# Patient Record
Sex: Male | Born: 1955 | Race: White | Hispanic: No | Marital: Married | Smoking: Former smoker
Health system: Southern US, Community
[De-identification: ages and names within clinical notes are randomized; demographics above are authoritative.]

## PROBLEM LIST (undated history)

## (undated) DIAGNOSIS — E119 Type 2 diabetes mellitus without complications: Secondary | ICD-10-CM

## (undated) DIAGNOSIS — M1990 Unspecified osteoarthritis, unspecified site: Secondary | ICD-10-CM

## (undated) DIAGNOSIS — R7303 Prediabetes: Secondary | ICD-10-CM

## (undated) DIAGNOSIS — T7840XA Allergy, unspecified, initial encounter: Secondary | ICD-10-CM

## (undated) DIAGNOSIS — E785 Hyperlipidemia, unspecified: Secondary | ICD-10-CM

## (undated) DIAGNOSIS — I10 Essential (primary) hypertension: Secondary | ICD-10-CM

## (undated) DIAGNOSIS — J449 Chronic obstructive pulmonary disease, unspecified: Secondary | ICD-10-CM

## (undated) DIAGNOSIS — C801 Malignant (primary) neoplasm, unspecified: Secondary | ICD-10-CM

## (undated) HISTORY — DX: Allergy, unspecified, initial encounter: T78.40XA

## (undated) HISTORY — PX: EYE SURGERY: SHX253

## (undated) HISTORY — PX: TONSILLECTOMY: SUR1361

## (undated) HISTORY — PX: JOINT REPLACEMENT: SHX530

## (undated) HISTORY — DX: Malignant (primary) neoplasm, unspecified: C80.1

## (undated) HISTORY — DX: Chronic obstructive pulmonary disease, unspecified: J44.9

## (undated) HISTORY — DX: Unspecified osteoarthritis, unspecified site: M19.90

## (undated) HISTORY — DX: Type 2 diabetes mellitus without complications: E11.9

## (undated) HISTORY — PX: KNEE SURGERY: SHX244

## (undated) HISTORY — PX: OTHER SURGICAL HISTORY: SHX169

---

## 2013-09-17 DIAGNOSIS — Z96652 Presence of left artificial knee joint: Secondary | ICD-10-CM | POA: Insufficient documentation

## 2013-09-17 HISTORY — DX: Presence of left artificial knee joint: Z96.652

## 2015-12-22 ENCOUNTER — Ambulatory Visit
Admission: EM | Admit: 2015-12-22 | Discharge: 2015-12-22 | Disposition: A | Discharge status: Home / Self Care | Payer: Federal, State, Local not specified - PPO | Attending: Family Medicine | Admitting: Family Medicine

## 2015-12-22 DIAGNOSIS — M545 Low back pain: Secondary | ICD-10-CM | POA: Diagnosis not present

## 2015-12-22 HISTORY — DX: Hyperlipidemia, unspecified: E78.5

## 2015-12-22 HISTORY — DX: Essential (primary) hypertension: I10

## 2015-12-22 LAB — URINALYSIS COMPLETE WITH MICROSCOPIC (ARMC ONLY)
BILIRUBIN URINE: NEGATIVE
GLUCOSE, UA: NEGATIVE mg/dL
HGB URINE DIPSTICK: NEGATIVE
Ketones, ur: NEGATIVE mg/dL
Leukocytes, UA: NEGATIVE
Nitrite: NEGATIVE
Protein, ur: NEGATIVE mg/dL
RBC / HPF: NONE SEEN RBC/hpf (ref 0–5)
Specific Gravity, Urine: >1.03 — ABNORMAL HIGH (ref 1.005–1.030)
pH: 5 (ref 5.0–8.0)

## 2015-12-22 MED ORDER — CYCLOBENZAPRINE HCL 10 MG PO TABS: 10.0000 mg | ORAL_TABLET | Freq: Two times a day (BID) | ORAL | Status: DC | PRN

## 2015-12-22 MED ORDER — SULFAMETHOXAZOLE-TRIMETHOPRIM 800-160 MG PO TABS: 1.0000 | ORAL_TABLET | Freq: Two times a day (BID) | ORAL | Status: AC

## 2015-12-22 NOTE — Discharge Instructions (Signed)
Take medication as prescribed. Rest. Drink plenty of fluids.   Follow up with your primary care physician this week as discussed. Return to Urgent care for new or worsening concerns.    Back Pain, Adult Back pain is very common in adults.The cause of back pain is rarely dangerous and the pain often gets better over time.The cause of your back pain may not be known. Some common causes of back pain include:  Strain of the muscles or ligaments supporting the spine.  Wear and tear (degeneration) of the spinal disks.  Arthritis.  Direct injury to the back. For many people, back pain may return. Since back pain is rarely dangerous, most people can learn to manage this condition on their own. HOME CARE INSTRUCTIONS Watch your back pain for any changes. The following actions may help to lessen any discomfort you are feeling:  Remain active. It is stressful on your back to sit or stand in one place for long periods of time. Do not sit, drive, or stand in one place for more than 30 minutes at a time. Take short walks on even surfaces as soon as you are able.Try to increase the length of time you walk each day.  Exercise regularly as directed by your health care provider. Exercise helps your back heal faster. It also helps avoid future injury by keeping your muscles strong and flexible.  Do not stay in bed.Resting more than 1-2 days can delay your recovery.  Pay attention to your body when you bend and lift. The most comfortable positions are those that put less stress on your recovering back. Always use proper lifting techniques, including:  Bending your knees.  Keeping the load close to your body.  Avoiding twisting.  Find a comfortable position to sleep. Use a firm mattress and lie on your side with your knees slightly bent. If you lie on your back, put a pillow under your knees.  Avoid feeling anxious or stressed.Stress increases muscle tension and can worsen back pain.It is  important to recognize when you are anxious or stressed and learn ways to manage it, such as with exercise.  Take medicines only as directed by your health care provider. Over-the-counter medicines to reduce pain and inflammation are often the most helpful.Your health care provider may prescribe muscle relaxant drugs.These medicines help dull your pain so you can more quickly return to your normal activities and healthy exercise.  Apply ice to the injured area:  Put ice in a plastic bag.  Place a towel between your skin and the bag.  Leave the ice on for 20 minutes, 2-3 times a day for the first 2-3 days. After that, ice and heat may be alternated to reduce pain and spasms.  Maintain a healthy weight. Excess weight puts extra stress on your back and makes it difficult to maintain good posture. SEEK MEDICAL CARE IF:  You have pain that is not relieved with rest or medicine.  You have increasing pain going down into the legs or buttocks.  You have pain that does not improve in one week.  You have night pain.  You lose weight.  You have a fever or chills. SEEK IMMEDIATE MEDICAL CARE IF:   You develop new bowel or bladder control problems.  You have unusual weakness or numbness in your arms or legs.  You develop nausea or vomiting.  You develop abdominal pain.  You feel faint.   This information is not intended to replace advice given to you by  your health care provider. Make sure you discuss any questions you have with your health care provider.   Document Released: 05/31/2005 Document Revised: 06/21/2014 Document Reviewed: 10/02/2013 Elsevier Interactive Patient Education 2016 Elsevier Inc.  Musculoskeletal Pain Musculoskeletal pain is muscle and boney aches and pains. These pains can occur in any part of the body. Your caregiver may treat you without knowing the cause of the pain. They may treat you if blood or urine tests, X-rays, and other tests were normal.   CAUSES There is often not a definite cause or reason for these pains. These pains may be caused by a type of germ (virus). The discomfort may also come from overuse. Overuse includes working out too hard when your body is not fit. Boney aches also come from weather changes. Bone is sensitive to atmospheric pressure changes. HOME CARE INSTRUCTIONS   Ask when your test results will be ready. Make sure you get your test results.  Only take over-the-counter or prescription medicines for pain, discomfort, or fever as directed by your caregiver. If you were given medications for your condition, do not drive, operate machinery or power tools, or sign legal documents for 24 hours. Do not drink alcohol. Do not take sleeping pills or other medications that may interfere with treatment.  Continue all activities unless the activities cause more pain. When the pain lessens, slowly resume normal activities. Gradually increase the intensity and duration of the activities or exercise.  During periods of severe pain, bed rest may be helpful. Lay or sit in any position that is comfortable.  Putting ice on the injured area.  Put ice in a bag.  Place a towel between your skin and the bag.  Leave the ice on for 15 to 20 minutes, 3 to 4 times a day.  Follow up with your caregiver for continued problems and no reason can be found for the pain. If the pain becomes worse or does not go away, it may be necessary to repeat tests or do additional testing. Your caregiver may need to look further for a possible cause. SEEK IMMEDIATE MEDICAL CARE IF:  You have pain that is getting worse and is not relieved by medications.  You develop chest pain that is associated with shortness or breath, sweating, feeling sick to your stomach (nauseous), or throw up (vomit).  Your pain becomes localized to the abdomen.  You develop any new symptoms that seem different or that concern you. MAKE SURE YOU:   Understand these  instructions.  Will watch your condition.  Will get help right away if you are not doing well or get worse.   This information is not intended to replace advice given to you by your health care provider. Make sure you discuss any questions you have with your health care provider.   Document Released: 05/31/2005 Document Revised: 08/23/2011 Document Reviewed: 02/02/2013 Elsevier Interactive Patient Education [employer].

## 2015-12-22 NOTE — ED Provider Notes (Signed)
[facility]  ____________________________________________  Time seen: Approximately [id number]  PM  I have reviewed the triage vital signs and the nursing notes.   HISTORY  Chief Complaint Back Pain   HPI Clifford Ramirez is a 60 y.o. male presents with a complaint of right lower back pain. Patient reports that pain has been present for about 3 weeks. Patient reports that pain feels like a spasm. Patient states he has 0 pain at rest. Patient states pain is only with certain activity. Patient states pain is mostly with left twisting. Patient denies any fall or trauma. Denies known injury. Patient reports that he has had 30 years of intermittent low back pain slightly different than current back pain as he states pain usually resolves after a week or 2. Patient reports he plays golf a lot. Patient states he has continued to play golf almost daily throughout his back pain. Patient states that he last played golf today and reports that he did play the full 18 holes. Patient reports after playing golf he had more pain in his right low back and states then took ibuprofen which helped.   Patient reports he has intermittently taken his wife's Flexeril at night to help with pain when he sleeps as he normally lies on that side to sleep. Patient reports the Flexeril did resolve his pain. Patient again denies any pain at rest but reports pain is only with active movement. Denies any pain radiation. Denies any numbness or tingling sensation. Denies any abdominal pain. Denies pain radiation. Denies dysuria, penile or testicular pain or swelling or discharge, urinary frequency, urinary urgency, urinary hesitancy, blood in urine, blood in stool, blood in toilet, constipation, fevers, chest pain or shortness of breath. Reports last bowel movement yesterday and described as normal. Patient reports he does currently have an external hemorrhoid, but denies complaints or pain with it.  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 Denies injury at work. Reports he recently moved to this area does not currently have a primary physician.    Past Medical History  Diagnosis Date  . Hypertension   . Hyperlipidemia     There are no active problems to display for this patient.   Past Surgical History  Procedure Laterality Date  . Knee surgery      Left    Current Outpatient Rx  Name  Route  Sig  Dispense  Refill  . aspirin 81 MG tablet   Oral   Take 81 mg by mouth daily.         Marland Kitchen atorvastatin (LIPITOR) 10 MG tablet   Oral   Take 10 mg by mouth daily.         Marland Kitchen losartan-hydrochlorothiazide (HYZAAR) 100-12.5 MG tablet   Oral   Take 1 tablet by mouth daily.           Allergies Review of patient's allergies indicates no known allergies.  Family History  Problem Relation Age of Onset  . Cancer Father     Social History Social History  Substance Use Topics  . Smoking status: Former Research scientist (life sciences)  . Smokeless tobacco: Never Used  . Alcohol Use: Yes    Review of Systems Constitutional: No fever/chills Eyes: No visual changes. ENT: No sore throat. Cardiovascular: Denies chest pain. Respiratory: Denies shortness of breath. Gastrointestinal: No abdominal pain.  No nausea, no vomiting.  No diarrhea.  No constipation. Genitourinary: Negative for dysuria. Musculoskeletal: Positive for back pain. Skin: Negative for rash. Neurological: Negative  for headaches, focal weakness or numbness.  10-point ROS otherwise negative.  ____________________________________________   PHYSICAL EXAM:  VITAL SIGNS: ED Triage Vitals  Enc Vitals Group     BP 12/22/15 1613 126/88 mmHg     Pulse Rate 12/22/15 1613 85     Resp 12/22/15 1613 18     Temp 12/22/15 1613 98.1 F (36.7 C)     Temp Source 12/22/15 1613 Oral     SpO2 12/22/15 1613 99 %     Weight 12/22/15 1613 256 lb (116.121 kg)     Height 12/22/15 1613 6\' 2"  (1.88 m)     Head Cir --      Peak Flow --      Pain Score 12/22/15 1611  3     Pain Loc --      Pain Edu? --      Excl. in [place]? --     Constitutional: Alert and oriented. Well appearing and in no acute distress. Eyes: Conjunctivae are normal. PERRL. EOMI. Head: Atraumatic.  Ears: Normal external appearance bilaterally.  Nose: No congestion/rhinnorhea.  Mouth/Throat: Mucous membranes are moist.  Cardiovascular: Normal rate, regular rhythm. Grossly normal heart sounds.  Good peripheral circulation. Respiratory: Normal respiratory effort.  No retractions. Lungs CTAB. No wheezes, rales or rhonchi. Gastrointestinal: Soft and nontender. Obese abdomen. Normal Bowel sounds.No CVA tenderness. Musculoskeletal: No lower or upper extremity tenderness nor edema. No midline cervical, thoracic or lumbar tenderness to palpation. Patient changes position from sitting to standing to ambulate quickly without discomfort distress noted. Except: Right lower paralumbar lumbar mild tenderness palpation muscular lower latissimus dorsi, no midline tenderness, no ecchymosis, no erythema, no swelling, no rash, full range of motion, pain with left rotation, no pain with lumbar flexion and extension, no saddle anesthesia, steady gait, normal plantar flexion and dorsiflexion bilaterally. Neurologic:  Normal speech and language. No gross focal neurologic deficits are appreciated. No gait instability. Skin:  Skin is warm, dry and intact. No rash noted. Psychiatric: Mood and affect are normal. Speech and behavior are normal.  ____________________________________________   LABS (all labs ordered are listed, but only abnormal results are displayed)  Labs Reviewed  URINALYSIS COMPLETEWITH MICROSCOPIC ([place] ONLY) - Abnormal; Notable for the following:    Specific Gravity, Urine >1.030 (*)    Bacteria, UA FEW (*)    Squamous Epithelial / LPF 0-5 (*)    All other components within normal limits     INITIAL IMPRESSION / ASSESSMENT AND PLAN / ED COURSE  Pertinent labs & imaging results that  were available during my care of the patient were reviewed by me and considered in my medical decision making (see chart for details).  Well-appearing patient. No acute distress. Presents for the complaints of intermittent pain to right lower back of the last 3 weeks. Denies any pain at rest. Denies fall or trauma. Patient states pain is only with movement and is fully reproducible by that movement and direct palpation. Denies fall or known trauma. Patient with right lower back pain over lower latissimus dorsi with palpation, and per patient this fully reproduces pain. Abdomen soft and nontender. Patient expresses concern that he has had continued pain in this area. No midline tenderness. Suspect muscular strain and as patient has continued play golf almost daily continuing to re-aggravate injury. Will evaluate urinalysis. Patient declined IM Toradol.   Urinalysis with few bacteria. Discussed with patient as patient declines any dysuria suspect contamination and discuss plan to culture urinalysis. Patient requests culture not be  performed, and we'll conservatively initiate oral Bactrim due concern for possible urinary infection. Encouraged patient to rest stretch, HEAT and ice. Will treat with when necessary Flexeril for muscular strain. Information for local primary care physician given.Discussed indication, risks and benefits of medications with patient. Encouraged prompt reevaluation for any fever, dysuria, rectal pain, abdominal pain, increased pain or worsening concerns.  Discussed  follow up with Primary care physician this week. Discussed follow up and return parameters including no resolution or any worsening concerns. Patient verbalized understanding and agreed to plan.   ____________________________________________   FINAL CLINICAL IMPRESSION(S) / ED DIAGNOSES  Final diagnoses:  Right low back pain, with sciatica presence unspecified     Discharge Medication List as of 12/22/2015  5:10 PM     START taking these medications   Details  cyclobenzaprine (FLEXERIL) 10 MG tablet Take 1 tablet (10 mg total) by mouth 2 (two) times daily as needed for muscle spasms (do not drive or operate machinery while taking)., Starting 12/22/2015, Until Discontinued, Normal    sulfamethoxazole-trimethoprim (BACTRIM DS,SEPTRA DS) 800-160 MG tablet Take 1 tablet by mouth 2 (two) times daily., Starting 12/22/2015, Until Mon 12/29/15, Normal        Note: This dictation was prepared with Dragon dictation along with smaller phrase technology. Any transcriptional errors that result from this process are unintentional.       Marylene Land, NP 12/22/15 1905  Marylene Land, NP 12/22/15 1907

## 2015-12-22 NOTE — ED Notes (Signed)
Patient presents with right back/flank pain. It started hurting about a month ago. He works 4 ten hours days a week and the last month he has had trouble with his back.

## 2016-01-26 DIAGNOSIS — E785 Hyperlipidemia, unspecified: Secondary | ICD-10-CM | POA: Insufficient documentation

## 2016-01-26 DIAGNOSIS — E119 Type 2 diabetes mellitus without complications: Secondary | ICD-10-CM

## 2016-01-26 DIAGNOSIS — E1169 Type 2 diabetes mellitus with other specified complication: Secondary | ICD-10-CM | POA: Insufficient documentation

## 2016-01-26 DIAGNOSIS — I10 Essential (primary) hypertension: Secondary | ICD-10-CM | POA: Insufficient documentation

## 2016-01-26 DIAGNOSIS — R7303 Prediabetes: Secondary | ICD-10-CM | POA: Insufficient documentation

## 2016-01-26 DIAGNOSIS — I152 Hypertension secondary to endocrine disorders: Secondary | ICD-10-CM | POA: Insufficient documentation

## 2016-01-26 HISTORY — DX: Type 2 diabetes mellitus without complications: E11.9

## 2016-06-25 ENCOUNTER — Encounter: Payer: Self-pay | Admitting: *Deleted

## 2016-06-28 ENCOUNTER — Ambulatory Visit: Admit: 2016-06-28 | Payer: Federal, State, Local not specified - PPO | Admitting: Gastroenterology

## 2016-06-28 ENCOUNTER — Encounter
Admission: RE | Disposition: A | Discharge status: Home / Self Care | Payer: Self-pay | Source: Ambulatory Visit | Attending: Gastroenterology

## 2016-06-28 ENCOUNTER — Ambulatory Visit
Admission: RE | Admit: 2016-06-28 | Discharge: 2016-06-28 | Disposition: A | Discharge status: Home / Self Care | Payer: Federal, State, Local not specified - PPO | Source: Ambulatory Visit | Attending: Gastroenterology | Admitting: Gastroenterology

## 2016-06-28 ENCOUNTER — Ambulatory Visit: Payer: Federal, State, Local not specified - PPO | Admitting: Anesthesiology

## 2016-06-28 ENCOUNTER — Encounter: Payer: Self-pay | Admitting: *Deleted

## 2016-06-28 DIAGNOSIS — K5730 Diverticulosis of large intestine without perforation or abscess without bleeding: Secondary | ICD-10-CM | POA: Diagnosis not present

## 2016-06-28 DIAGNOSIS — D123 Benign neoplasm of transverse colon: Secondary | ICD-10-CM | POA: Insufficient documentation

## 2016-06-28 DIAGNOSIS — E119 Type 2 diabetes mellitus without complications: Secondary | ICD-10-CM | POA: Insufficient documentation

## 2016-06-28 DIAGNOSIS — Z7984 Long term (current) use of oral hypoglycemic drugs: Secondary | ICD-10-CM | POA: Diagnosis not present

## 2016-06-28 DIAGNOSIS — R7303 Prediabetes: Secondary | ICD-10-CM | POA: Insufficient documentation

## 2016-06-28 DIAGNOSIS — Z7982 Long term (current) use of aspirin: Secondary | ICD-10-CM | POA: Diagnosis not present

## 2016-06-28 DIAGNOSIS — I10 Essential (primary) hypertension: Secondary | ICD-10-CM | POA: Insufficient documentation

## 2016-06-28 DIAGNOSIS — Z1211 Encounter for screening for malignant neoplasm of colon: Secondary | ICD-10-CM | POA: Diagnosis present

## 2016-06-28 DIAGNOSIS — E785 Hyperlipidemia, unspecified: Secondary | ICD-10-CM | POA: Diagnosis not present

## 2016-06-28 DIAGNOSIS — Z79899 Other long term (current) drug therapy: Secondary | ICD-10-CM | POA: Diagnosis not present

## 2016-06-28 HISTORY — DX: Prediabetes: R73.03

## 2016-06-28 HISTORY — PX: COLONOSCOPY WITH PROPOFOL: SHX5780

## 2016-06-28 SURGERY — COLONOSCOPY WITH PROPOFOL
Anesthesia: General

## 2016-06-28 SURGERY — EGD (ESOPHAGOGASTRODUODENOSCOPY)
Anesthesia: General

## 2016-06-28 MED ORDER — PROPOFOL 10 MG/ML IV BOLUS
INTRAVENOUS | Status: AC
  Filled 2016-06-28: qty 20

## 2016-06-28 MED ORDER — SODIUM CHLORIDE 0.9 % IV SOLN
INTRAVENOUS | Status: DC
  Administered 2016-06-28: 1000 mL via INTRAVENOUS

## 2016-06-28 MED ORDER — PROPOFOL 500 MG/50ML IV EMUL
INTRAVENOUS | Status: DC | PRN
  Administered 2016-06-28: 150 ug/kg/min via INTRAVENOUS

## 2016-06-28 MED ORDER — PROPOFOL 10 MG/ML IV BOLUS
INTRAVENOUS | Status: DC | PRN
  Administered 2016-06-28: 40 mg via INTRAVENOUS
  Administered 2016-06-28: 10 mg via INTRAVENOUS
  Administered 2016-06-28: 50 mg via INTRAVENOUS

## 2016-06-28 MED ORDER — SODIUM CHLORIDE 0.9 % IV SOLN
2.0000 g | Freq: Once | INTRAVENOUS | Status: AC
  Administered 2016-06-28: 2 g via INTRAVENOUS
  Filled 2016-06-28: qty 2000

## 2016-06-28 MED ORDER — LACTATED RINGERS IV SOLN
INTRAVENOUS | Status: DC | PRN
  Administered 2016-06-28: 09:00:00 via INTRAVENOUS

## 2016-06-28 NOTE — Anesthesia Preprocedure Evaluation (Signed)
Anesthesia Evaluation  Patient identified by MRN, date of birth, ID band Patient awake    Reviewed: Allergy & Precautions, H&P , NPO status , Patient's Chart, lab work & pertinent test results  History of Anesthesia Complications Negative for: history of anesthetic complications  Airway Mallampati: III  TM Distance: >3 FB Neck ROM: limited    Dental  (+) Poor Dentition, Chipped, Caps   Pulmonary neg shortness of breath, former smoker,    Pulmonary exam normal breath sounds clear to auscultation       Cardiovascular Exercise Tolerance: Good hypertension, (-) angina(-) Past MI and (-) DOE Normal cardiovascular exam Rhythm:regular Rate:Normal     Neuro/Psych negative neurological ROS  negative psych ROS   GI/Hepatic negative GI ROS, Neg liver ROS, neg GERD  ,  Endo/Other  diabetes, Type 2, Oral Hypoglycemic Agents  Renal/GU negative Renal ROS  negative genitourinary   Musculoskeletal   Abdominal   Peds  Hematology negative hematology ROS (+)   Anesthesia Other Findings Signs and symptoms suggestive of sleep apnea   Past Medical History: No date: Hyperlipidemia No date: Hypertension  Past Surgical History: No date: colonscopy No date: KNEE SURGERY     Comment: Left No date: ligation of varicosity in scrotum No date: TONSILLECTOMY     Reproductive/Obstetrics negative OB ROS                             Anesthesia Physical Anesthesia Plan  ASA: III  Anesthesia Plan: General   Post-op Pain Management:    Induction:   Airway Management Planned:   Additional Equipment:   Intra-op Plan:   Post-operative Plan:   Informed Consent: I have reviewed the patients History and Physical, chart, labs and discussed the procedure including the risks, benefits and alternatives for the proposed anesthesia with the patient or authorized representative who has indicated his/her  understanding and acceptance.   Dental Advisory Given  Plan Discussed with: Anesthesiologist, CRNA and Surgeon  Anesthesia Plan Comments:         Anesthesia Quick Evaluation

## 2016-06-28 NOTE — H&P (Signed)
Outpatient short stay form Pre-procedure 06/28/2016 8:43 AM Lollie Sails MD  Primary Physician: Ellison Carwin PA  Reason for visit:  Screening colonoscopy  History of present illness:  Patient is a 61 year old male presenting today as above. He tolerated his prep well. He does take 81 mg aspirin regularly. He takes no other aspirin products or blood thinning agents.    Current Facility-Administered Medications:  .  0.9 %  sodium chloride infusion, , Intravenous, Continuous, Lollie Sails, MD, Last Rate: 20 mL/hr at 06/28/16 0748, 1,000 mL at 06/28/16 0748  Prescriptions Prior to Admission  Medication Sig Dispense Refill Last Dose  . Docosahexaenoic Acid (DHA OMEGA 3 PO) Take 1,000 mg by mouth.     . losartan-hydrochlorothiazide (HYZAAR) 100-12.5 MG tablet Take 1 tablet by mouth daily.   06/28/2016 at 0530  . metFORMIN (GLUCOPHAGE) 500 MG tablet Take by mouth 2 (two) times daily with a meal.     . Multiple Vitamin (MULTIVITAMIN) tablet Take 1 tablet by mouth daily.     Marland Kitchen aspirin 81 MG tablet Take 81 mg by mouth daily.     Marland Kitchen atorvastatin (LIPITOR) 10 MG tablet Take 10 mg by mouth daily.     . cyclobenzaprine (FLEXERIL) 10 MG tablet Take 1 tablet (10 mg total) by mouth 2 (two) times daily as needed for muscle spasms (do not drive or operate machinery while taking). 20 tablet 0      No Known Allergies   Past Medical History:  Diagnosis Date  . Hyperlipidemia   . Hypertension   . Pre-diabetes     Review of systems:      Physical Exam    Heart and lungs: Regular rate and rhythm without rub or gallop, lungs are bilaterally clear.    HEENT: Normocephalic atraumatic eyes are anicteric    Other:     Pertinant exam for procedure: Soft nontender nondistended bowel sounds positive normoactive.    Planned proceedures: Colonoscopy and indicated procedures. IV antibiotic prophylaxis. I have discussed the risks benefits and complications of procedures to include not limited to  bleeding, infection, perforation and the risk of sedation and the patient wishes to proceed.    Lollie Sails, MD Gastroenterology 06/28/2016  8:43 AM

## 2016-06-28 NOTE — Transfer of Care (Signed)
Immediate Anesthesia Transfer of Care Note  Patient: Clifford Ramirez  Procedure(s) Performed: Procedure(s): COLONOSCOPY WITH PROPOFOL (N/A)  Patient Location: PACU  Anesthesia Type:General  Level of Consciousness: awake, alert  and oriented  Airway & Oxygen Therapy: Patient Spontanous Breathing  Post-op Assessment: Report given to RN and Post -op Vital signs reviewed and stable  Post vital signs: Reviewed and stable  Last Vitals:  Vitals:   06/28/16 0734  BP: 137/89  Pulse: 72  Resp: 18  Temp: 36.4 C    Last Pain:  Vitals:   06/28/16 0734  TempSrc: Tympanic         Complications: No apparent anesthesia complications

## 2016-06-28 NOTE — Op Note (Signed)
[facility] Gastroenterology Patient Name: Clifford Ramirez Procedure Date: 06/28/2016 8:30 AM MRN: [id number] Account #: [id number] Date of Birth: [birth year] Admit Type: Outpatient Age: 61 Room: [facility] ENDO ROOM 3 Gender: Male Note Status: Finalized Procedure:            Colonoscopy Indications:          Screening for colorectal malignant neoplasm Providers:            Lollie Sails, MD Referring MD:         Colbert Ewing, PA Medicines:            Monitored Anesthesia Care Complications:        No immediate complications. Procedure:            Pre-Anesthesia Assessment:                       - ASA Grade Assessment: II - A patient with mild                        systemic disease.                       After obtaining informed consent, the colonoscope was                        passed under direct vision. Throughout the procedure,                        the patient's blood pressure, pulse, and oxygen                        saturations were monitored continuously. The                        Colonoscope was introduced through the anus and                        advanced to the the cecum, identified by appendiceal                        orifice and ileocecal valve. The patient tolerated the                        procedure well. The quality of the bowel preparation                        was fair. Findings:      A 4 mm polyp was found in the proximal transverse colon. The polyp was       flat. The polyp was removed with a cold snare. Resection and retrieval       were complete.      A few small-mouthed diverticula were found in the sigmoid colon and       distal descending colon.      The retroflexed view of the distal rectum and anal verge was normal and       showed no anal or rectal abnormalities.      The exam was otherwise without abnormality.      The digital rectal exam was normal. Impression:           - Preparation of the colon was fair.       -  One 4 mm polyp in the proximal transverse colon,                        removed with a cold snare. Resected and retrieved.                       - Diverticulosis in the sigmoid colon and in the distal                        descending colon.                       - The distal rectum and anal verge are normal on                        retroflexion view.                       - The examination was otherwise normal. Recommendation:       - Discharge patient to home.                       - Telephone GI clinic for pathology results in 1 week.                       - Discharge patient to home. Procedure Code(s):    --- Professional ---                       [phone], Colonoscopy, flexible; with removal of tumor(s),                        polyp(s), or other lesion(s) by snare technique Diagnosis Code(s):    --- Professional ---                       Z12.11, Encounter for screening for malignant neoplasm                        of colon                       D12.3, Benign neoplasm of transverse colon (hepatic                        flexure or splenic flexure)                       K57.30, Diverticulosis of large intestine without                        perforation or abscess without bleeding CPT copyright 2016 American Medical Association. All rights reserved. The codes documented in this report are preliminary and upon coder review may  be revised to meet current compliance requirements. Lollie Sails, MD 06/28/2016 9:19:43 AM This report has been signed electronically. Number of Addenda: 0 Note Initiated On: 06/28/2016 8:30 AM Scope Withdrawal Time: 0 hours 12 minutes 4 seconds  Total Procedure Duration: 0 hours 23 minutes 53 seconds       [facility]

## 2016-06-28 NOTE — Anesthesia Postprocedure Evaluation (Signed)
Anesthesia Post Note  Patient: Clifford Ramirez  Procedure(s) Performed: Procedure(s) (LRB): COLONOSCOPY WITH PROPOFOL (N/A)  Patient location during evaluation: Endoscopy Anesthesia Type: General Level of consciousness: awake and alert Pain management: pain level controlled Vital Signs Assessment: post-procedure vital signs reviewed and stable Respiratory status: spontaneous breathing, nonlabored ventilation, respiratory function stable and patient connected to nasal cannula oxygen Cardiovascular status: blood pressure returned to baseline and stable Postop Assessment: no signs of nausea or vomiting Anesthetic complications: no     Last Vitals:  Vitals:   06/28/16 0941 06/28/16 0951  BP: 140/79 (!) 140/104  Pulse: 62 (!) 56  Resp: 10 18  Temp:      Last Pain:  Vitals:   06/28/16 0921  TempSrc: Tympanic                 Precious Haws Kanden Carey

## 2016-06-29 ENCOUNTER — Encounter: Payer: Self-pay | Admitting: Gastroenterology

## 2016-06-29 LAB — SURGICAL PATHOLOGY

## 2016-12-17 ENCOUNTER — Emergency Department
Admission: EM | Admit: 2016-12-17 | Discharge: 2016-12-18 | Disposition: A | Discharge status: Home / Self Care | Payer: Federal, State, Local not specified - PPO | Attending: Emergency Medicine | Admitting: Emergency Medicine

## 2016-12-17 ENCOUNTER — Emergency Department: Payer: Federal, State, Local not specified - PPO

## 2016-12-17 ENCOUNTER — Encounter: Payer: Self-pay | Admitting: Emergency Medicine

## 2016-12-17 DIAGNOSIS — Z87891 Personal history of nicotine dependence: Secondary | ICD-10-CM | POA: Insufficient documentation

## 2016-12-17 DIAGNOSIS — I10 Essential (primary) hypertension: Secondary | ICD-10-CM | POA: Insufficient documentation

## 2016-12-17 DIAGNOSIS — Z7982 Long term (current) use of aspirin: Secondary | ICD-10-CM | POA: Diagnosis not present

## 2016-12-17 DIAGNOSIS — N200 Calculus of kidney: Secondary | ICD-10-CM | POA: Diagnosis not present

## 2016-12-17 DIAGNOSIS — Z7984 Long term (current) use of oral hypoglycemic drugs: Secondary | ICD-10-CM | POA: Insufficient documentation

## 2016-12-17 DIAGNOSIS — Z79899 Other long term (current) drug therapy: Secondary | ICD-10-CM | POA: Insufficient documentation

## 2016-12-17 DIAGNOSIS — R109 Unspecified abdominal pain: Secondary | ICD-10-CM | POA: Diagnosis present

## 2016-12-17 LAB — BASIC METABOLIC PANEL
ANION GAP: 7 (ref 5–15)
BUN: 14 mg/dL (ref 6–20)
CO2: 29 mmol/L (ref 22–32)
Calcium: 9.6 mg/dL (ref 8.9–10.3)
Chloride: 103 mmol/L (ref 101–111)
Creatinine, Ser: 0.91 mg/dL (ref 0.61–1.24)
GFR calc Af Amer: >60 mL/min (ref >60)
GFR calc non Af Amer: >60 mL/min (ref >60)
GLUCOSE: 140 mg/dL — AB (ref 65–99)
POTASSIUM: 4.1 mmol/L (ref 3.5–5.1)
Sodium: 139 mmol/L (ref 135–145)

## 2016-12-17 LAB — CBC
HEMATOCRIT: 46.8 % (ref 40.0–52.0)
Hemoglobin: 16.1 g/dL (ref 13.0–18.0)
MCH: 28.7 pg (ref 26.0–34.0)
MCHC: 34.4 g/dL (ref 32.0–36.0)
MCV: 83.3 fL (ref 80.0–100.0)
Platelets: 176 10*3/uL (ref 150–440)
RBC: 5.62 MIL/uL (ref 4.40–5.90)
RDW: 14 % (ref 11.5–14.5)
WBC: 8.8 10*3/uL (ref 3.8–10.6)

## 2016-12-17 IMAGING — CT CT RENAL STONE PROTOCOL
2 of 4 series · 15 of 46 positions shown, 17 images · non-contrast
Comparison: None.

CLINICAL DATA: Initial evaluation for acute right flank pain.

EXAM:
CT ABDOMEN AND PELVIS WITHOUT CONTRAST
TECHNIQUE: Multidetector CT imaging of the abdomen and pelvis was performed
following the standard protocol without IV contrast.

[Series 2: stone full standard · axial · 0.96mm/px · z∈[-1055,-625]mm · 12 of 102 slices shown, 14 images]
[im 8/102  soft-tissue]
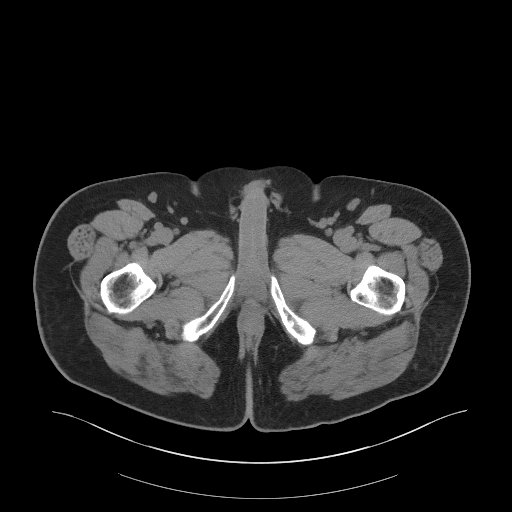
[im 8/102  bone]
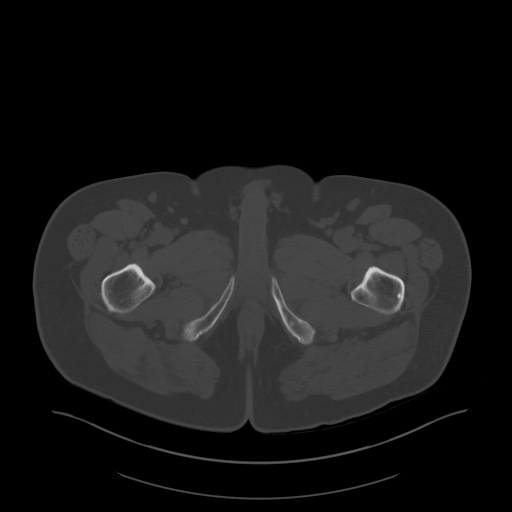
[im 15/102  soft-tissue]
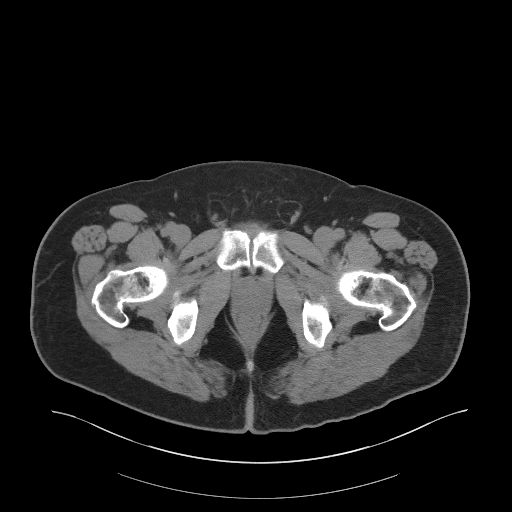
[im 23/102  soft-tissue]
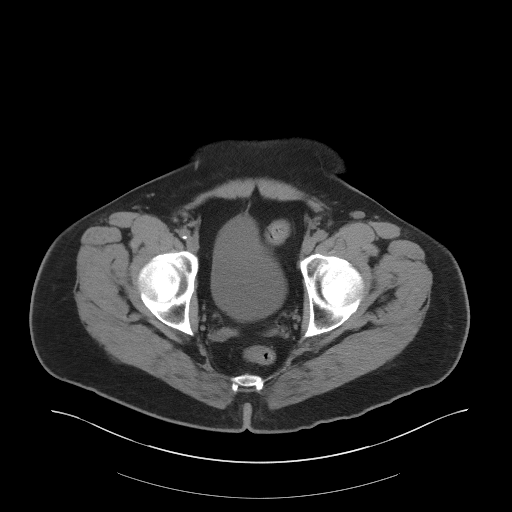
[im 30/102  soft-tissue]
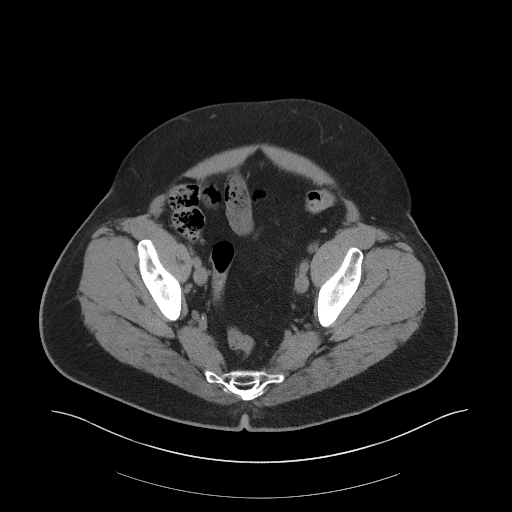
[im 38/102  soft-tissue]
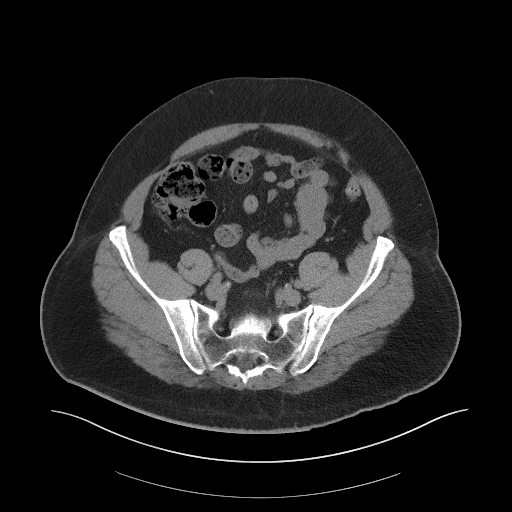
[im 45/102  soft-tissue]
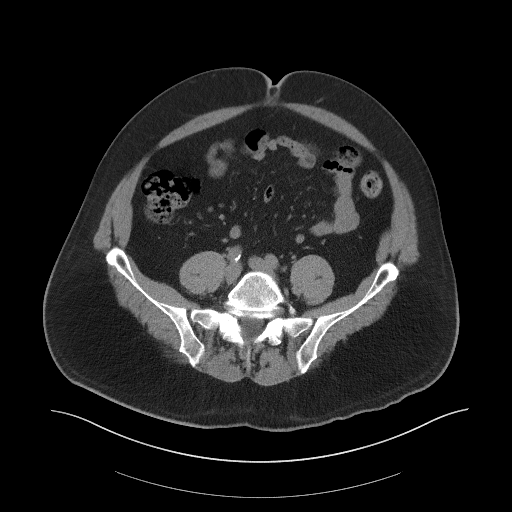
[im 57/102  soft-tissue]
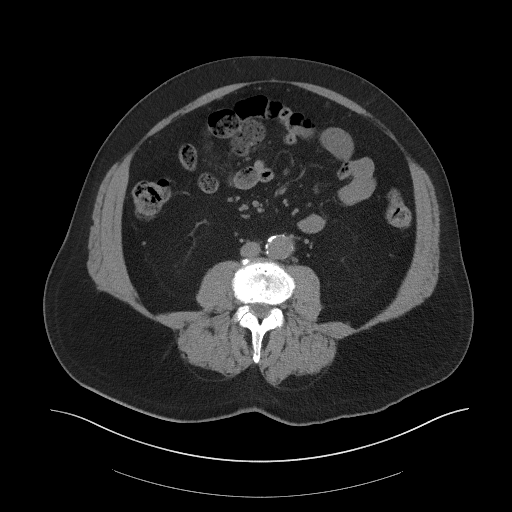
[im 64/102  soft-tissue]
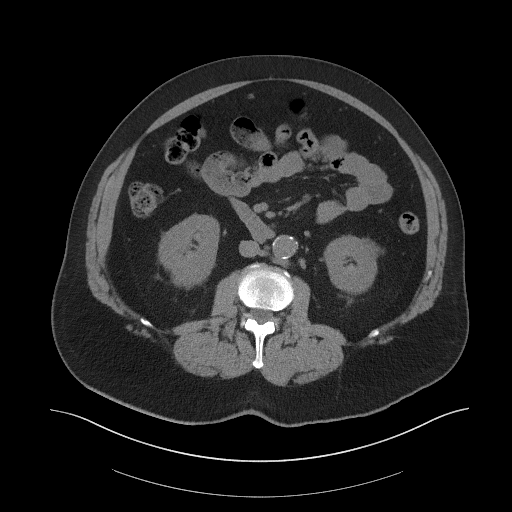
[im 72/102  soft-tissue]
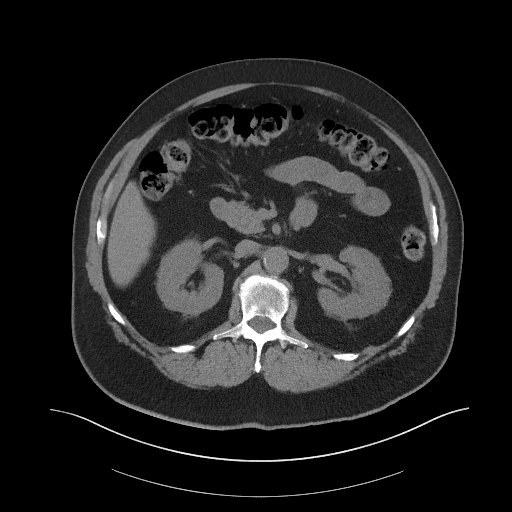
[im 72/102  bone]
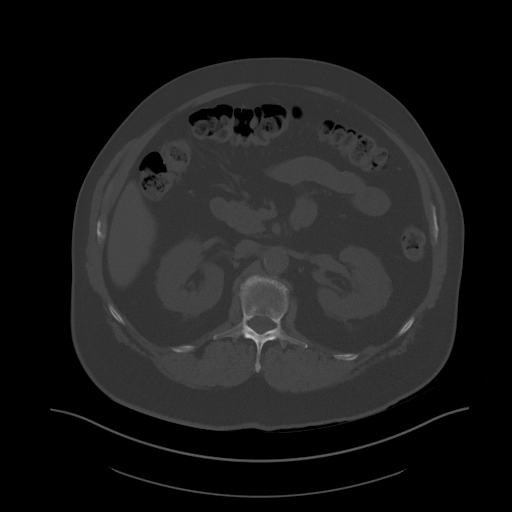
[im 79/102  soft-tissue]
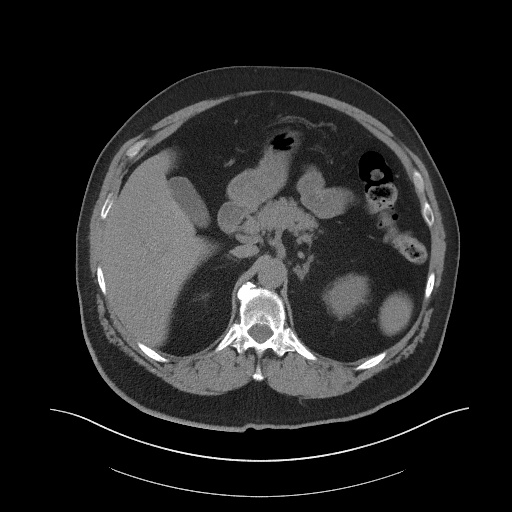
[im 87/102  soft-tissue]
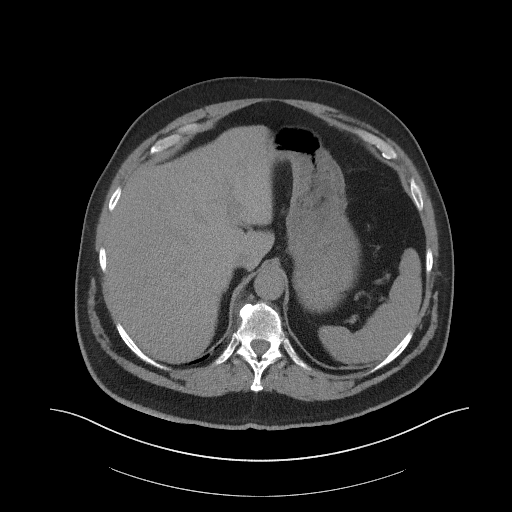
[im 94/102  soft-tissue]
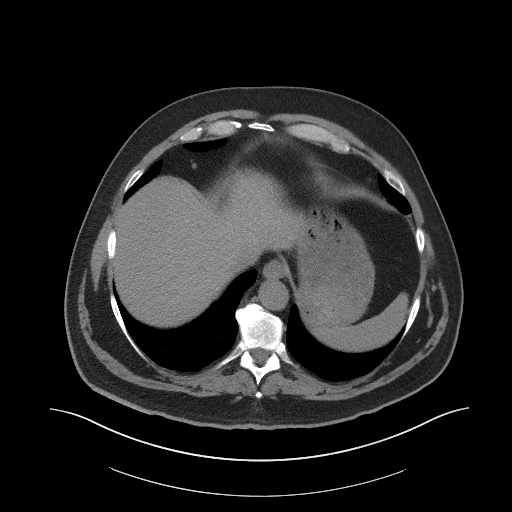

[Series 5: coronal · coronal · 0.93mm/px · 3 of 192 slices shown]
[im 64/192  soft-tissue]
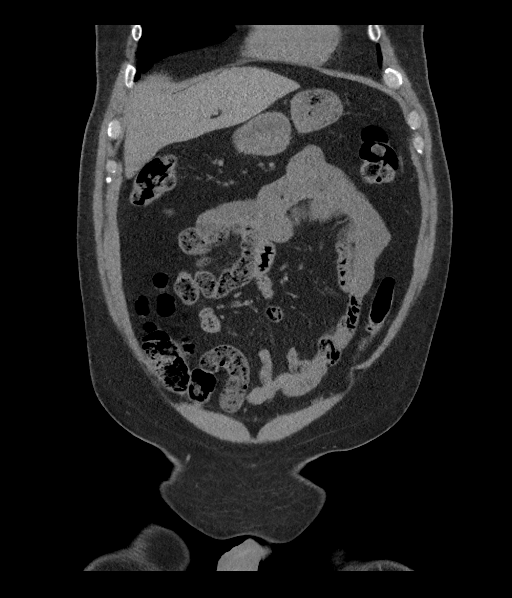
[im 85/192  soft-tissue]
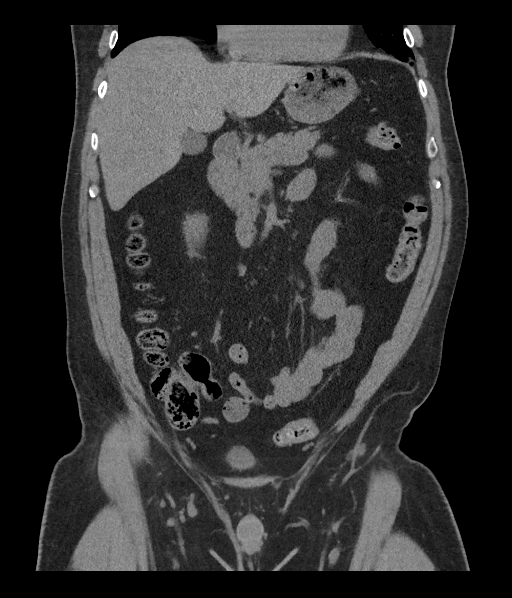
[im 107/192  soft-tissue]
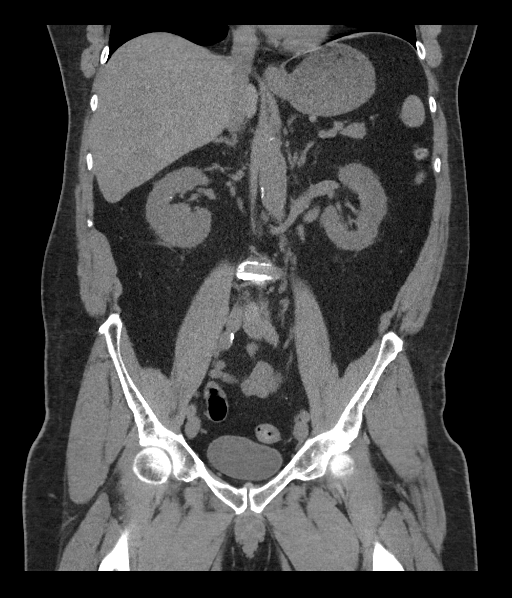

[15 of 46 positions shown; findings below may reference images not displayed]

FINDINGS: Lower chest: Mild scattered atelectatic changes present within the
visualized lung bases. Visualized lungs are otherwise clear.

Hepatobiliary: Liver demonstrates a normal unenhanced appearance.
Gallbladder within normal limits. No biliary dilatation.

Pancreas: Pancreas within normal limits.

Spleen: Spleen within normal limits.

Adrenals/Urinary Tract: 2.1 cm fatty lesion arising from the medial
limb of the right adrenal gland, most consistent with a mild lipoma.
Adrenal glands are otherwise unremarkable.

Kidneys equal in size. 2.3 cm exophytic cyst extends from the lower
pole of the left kidney. Subcentimeter hyperdensity extending from
the lower pole the left kidney too small the characterize, but
suspected to reflect a small proteinaceous or hemorrhagic cyst. Few
additional subcentimeter hypodensities also too small the
characterize, but statistically likely reflects small cysts as well.
Punctate 2 mm nonobstructive stone present within the interpolar
right kidney. No other radiopaque calculi identified within either
kidney. No stones seen along the course of either renal collecting
system. No hydroureter. Bladder within normal limits. No layering
stones within the bladder lumen.

Stomach/Bowel: Stomach within normal limits. No evidence for bowel
obstruction. Appendix within normal limits. No acute inflammatory
changes seen about the bowels.

Vascular/Lymphatic: Mild to moderate aorto bi-iliac atherosclerotic
disease. Infrarenal aorta ectatic measuring up to 2.7 cm. No
adenopathy.

Reproductive: Prostate within normal limits.

Other: No free air or fluid. The small fat containing paraumbilical
hernia noted.

Musculoskeletal: No acute osseous abnormality. No worrisome lytic or
blastic osseous lesions. Multilevel degenerative spondylolysis noted
within the lumbar spine, greatest at L3-4 and L5-S1.
IMPRESSION: 1. Punctate 2 mm nonobstructive right renal nephrolithiasis. No
other radiopaque calculi identified. No evidence for obstructive
uropathy.
2. No other acute intra-abdominal or pelvic process identified.
3. 2.1 cm right adrenal myelolipoma.
4. **An incidental finding of potential clinical significance has
been found. Ectatic abdominal aorta measuring up to 2.7 cm, at risk
for aneurysm development. Recommend followup by ultrasound in 5
years. This recommendation follows ACR consensus guidelines: White
Paper of the ACR Incidental Findings Committee II on Vascular
Findings. [HOSPITAL] [RP]; [DATE].**

## 2016-12-17 MED ORDER — HYDROMORPHONE HCL 1 MG/ML IJ SOLN
1.0000 mg | Freq: Once | INTRAMUSCULAR | Status: AC
  Administered 2016-12-17: 1 mg via INTRAVENOUS
  Filled 2016-12-17: qty 1

## 2016-12-17 MED ORDER — ONDANSETRON HCL 4 MG/2ML IJ SOLN
4.0000 mg | Freq: Once | INTRAMUSCULAR | Status: AC
  Administered 2016-12-17: 4 mg via INTRAVENOUS
  Filled 2016-12-17: qty 2

## 2016-12-17 MED ORDER — SODIUM CHLORIDE 0.9 % IV BOLUS (SEPSIS)
1000.0000 mL | Freq: Once | INTRAVENOUS | Status: AC
  Administered 2016-12-17: 1000 mL via INTRAVENOUS

## 2016-12-17 NOTE — ED Triage Notes (Signed)
Pt states onset of right flank pain that radiates to right lower quadrant this pm. Pt denies vomiting, fever, diarrhea. Last bowel movement this am. Pt with pwd skin, resps unlabored.

## 2016-12-17 NOTE — ED Notes (Signed)
Pt c/o R flank pain starting at 2000, denies other urinary sxs. Pt states took muscle relaxer and meloxicam w/o relief. Pt denies n/v at this time. Pt states cannot find position of comfort.

## 2016-12-17 NOTE — ED Provider Notes (Signed)
[facility] Emergency Department Provider Note   ____________________________________________   First MD Initiated Contact with Patient 12/17/16 2308     (approximate)  I have reviewed the triage vital signs and the nursing notes.   HISTORY  Chief Complaint Flank Pain    HPI Clifford Ramirez is a 61 y.o. male who presents to the ED from home with a chief complaint of right flank and abdominal pain. Patient reports sudden onset of right flank pain after dinner. Describes sharp pain radiating to his right lower abdomen. Denies associated fever, chills, chest pain, shortness of breath, nausea, vomiting, hematuria, dysuria. Denies recent travel or trauma. Took a muscle relaxer prior to arrival which did not alleviate his symptoms.   Past Medical History:  Diagnosis Date  . Hyperlipidemia   . Hypertension   . Pre-diabetes   Kidney stone over 30 years ago which did not require surgical intervention  There are no active problems to display for this patient.   Past Surgical History:  Procedure Laterality Date  . COLONOSCOPY WITH PROPOFOL N/A 06/28/2016   Procedure: COLONOSCOPY WITH PROPOFOL;  Surgeon: Lollie Sails, MD;  Location: [facility] ENDOSCOPY;  Service: Endoscopy;  Laterality: N/A;  . colonscopy    . KNEE SURGERY     Left  . ligation of varicosity in scrotum    . TONSILLECTOMY      Prior to Admission medications   Medication Sig Start Date End Date Taking? Authorizing Provider  aspirin 81 MG tablet Take 81 mg by mouth daily.    [provider]  atorvastatin (LIPITOR) 10 MG tablet Take 10 mg by mouth daily.    [provider]  cyclobenzaprine (FLEXERIL) 10 MG tablet Take 1 tablet (10 mg total) by mouth 2 (two) times daily as needed for muscle spasms (do not drive or operate machinery while taking). 12/22/15   Marylene Land, NP  Docosahexaenoic Acid (DHA OMEGA 3 PO) Take 1,000 mg by mouth.    [provider]    losartan-hydrochlorothiazide (HYZAAR) 100-12.5 MG tablet Take 1 tablet by mouth daily.    [provider]  metFORMIN (GLUCOPHAGE) 500 MG tablet Take by mouth 2 (two) times daily with a meal.    [provider]  Multiple Vitamin (MULTIVITAMIN) tablet Take 1 tablet by mouth daily.    [provider]  ondansetron (ZOFRAN ODT) 4 MG disintegrating tablet Take 1 tablet (4 mg total) by mouth every 8 (eight) hours as needed for nausea or vomiting. 12/18/16   Paulette Blanch, MD  oxyCODONE-acetaminophen (ROXICET) 5-325 MG tablet Take 1 tablet by mouth every 4 (four) hours as needed for severe pain. 12/18/16   Paulette Blanch, MD    Allergies Patient has no known allergies.  Family History  Problem Relation Age of Onset  . Cancer Father     Social History Social History  Substance Use Topics  . Smoking status: Former Research scientist (life sciences)  . Smokeless tobacco: Never Used  . Alcohol use Yes    Review of Systems  Constitutional: No fever/chills. Eyes: No visual changes. ENT: No sore throat. Cardiovascular: Denies chest pain. Respiratory: Denies shortness of breath. Gastrointestinal: Positive for right flank and abdominal pain.  No nausea, no vomiting.  No diarrhea.  No constipation. Genitourinary: Negative for dysuria. Musculoskeletal: Negative for back pain. Skin: Negative for rash. Neurological: Negative for headaches, focal weakness or numbness.   ____________________________________________   PHYSICAL EXAM:  VITAL SIGNS: ED Triage Vitals [12/17/16 2146]  Enc Vitals Group  BP (!) 149/90     Pulse Rate 76     Resp 16     Temp 97.9 F (36.6 C)     Temp Source Oral     SpO2 96 %     Weight 250 lb (113.4 kg)     Height 6\' 2"  (1.88 m)     Head Circumference      Peak Flow      Pain Score 8     Pain Loc      Pain Edu?      Excl. in [place]?     Constitutional: Alert and oriented. Well appearing and in no acute distress. Eyes: Conjunctivae are normal. PERRL.  EOMI. Head: Atraumatic. Nose: No congestion/rhinnorhea. Mouth/Throat: Mucous membranes are moist.  Oropharynx non-erythematous. Neck: No stridor.   Cardiovascular: Normal rate, regular rhythm. Grossly normal heart sounds.  Good peripheral circulation. Respiratory: Normal respiratory effort.  No retractions. Lungs CTAB. Gastrointestinal: Soft and nontender to light and deep palpation. No distention. No abdominal bruits. Mild right CVA tenderness. Musculoskeletal: No lower extremity tenderness nor edema.  No joint effusions. Neurologic:  Normal speech and language. No gross focal neurologic deficits are appreciated. No gait instability. Skin:  Skin is warm, dry and intact. No rash noted. Psychiatric: Mood and affect are normal. Speech and behavior are normal.  ____________________________________________   LABS (all labs ordered are listed, but only abnormal results are displayed)  Labs Reviewed  URINALYSIS, COMPLETE (UACMP) WITH MICROSCOPIC - Abnormal; Notable for the following:       Result Value   Color, Urine YELLOW (*)    APPearance CLEAR (*)    All other components within normal limits  BASIC METABOLIC PANEL - Abnormal; Notable for the following:    Glucose, Bld 140 (*)    All other components within normal limits  CBC   ____________________________________________  EKG  None ____________________________________________  RADIOLOGY  Ct Renal Stone Study  Result Date: 12/18/2016 CLINICAL DATA:  Initial evaluation for acute right flank pain. EXAM: CT ABDOMEN AND PELVIS WITHOUT CONTRAST TECHNIQUE: Multidetector CT imaging of the abdomen and pelvis was performed following the standard protocol without IV contrast. COMPARISON:  None. FINDINGS: Lower chest: Mild scattered atelectatic changes present within the visualized lung bases. Visualized lungs are otherwise clear. Hepatobiliary: Liver demonstrates a normal unenhanced appearance. Gallbladder within normal limits. No biliary  dilatation. Pancreas: Pancreas within normal limits. Spleen: Spleen within normal limits. Adrenals/Urinary Tract: 2.1 cm fatty lesion arising from the medial limb of the right adrenal gland, most consistent with a mild lipoma. Adrenal glands are otherwise unremarkable. Kidneys equal in size. 2.3 cm exophytic cyst extends from the lower pole of the left kidney. Subcentimeter hyperdensity extending from the lower pole the left kidney too small the characterize, but suspected to reflect a small proteinaceous or hemorrhagic cyst. Few additional subcentimeter hypodensities also too small the characterize, but statistically likely reflects small cysts as well. Punctate 2 mm nonobstructive stone present within the interpolar right kidney. No other radiopaque calculi identified within either kidney. No stones seen along the course of either renal collecting system. No hydroureter. Bladder within normal limits. No layering stones within the bladder lumen. Stomach/Bowel: Stomach within normal limits. No evidence for bowel obstruction. Appendix within normal limits. No acute inflammatory changes seen about the bowels. Vascular/Lymphatic: Mild to moderate aorto bi-iliac atherosclerotic disease. 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 No adenopathy. Reproductive: Prostate within normal limits. Other: No free air or fluid. The small fat containing paraumbilical  hernia noted. Musculoskeletal: No acute osseous abnormality. No worrisome lytic or blastic osseous lesions. Multilevel degenerative spondylolysis noted within the lumbar spine, greatest at L3-4 and L5-S1. IMPRESSION: 1. Punctate 2 mm nonobstructive right renal nephrolithiasis. No other radiopaque calculi identified. No evidence for obstructive uropathy. 2. No other acute intra-abdominal or pelvic process identified. 3. 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 4. **An incidental finding of potential clinical significance has been found. Ectatic abdominal aorta measuring  up to 2.7 cm, at risk for aneurysm development. Recommend followup by ultrasound in 5 years. This recommendation follows ACR consensus guidelines: White Paper of the ACR Incidental Findings Committee II on Vascular Findings. J Am Coll Radiol 2013; 10:789-794.** Electronically Signed   By: Jeannine Boga M.D.   On: 12/18/2016 00:00    ____________________________________________   PROCEDURES  Procedure(s) performed: None  Procedures  Critical Care performed: No  ____________________________________________   INITIAL IMPRESSION / ASSESSMENT AND PLAN / ED COURSE  Pertinent labs & imaging results that were available during my care of the patient were reviewed by me and considered in my medical decision making (see chart for details).  61 year old male who presents with right flank to abdominal pain; kidney stone 30 years prior. Received IV Dilaudid and Zofran prior to my arrival and is currently resting comfortably. Laboratory results unremarkable. Awaiting urine specimen. Will initiate IV fluid resuscitation and obtain CT renal colic study.  Clinical Course as of Dec 19 119  Sat Dec 18, 2016  0108 Updated patient and spouse on results of urinalysis and CT scan. In particular, we discussed ectatic aorta requiring surveillance by PCP. Strict return precautions given. Both verbalize understanding the plan of care.  [JS]    Clinical Course User Index [JS] Paulette Blanch, MD     ____________________________________________   FINAL CLINICAL IMPRESSION(S) / ED DIAGNOSES  Final diagnoses:  Right flank pain  Nephrolithiasis      NEW MEDICATIONS STARTED DURING THIS VISIT:  New Prescriptions   ONDANSETRON (ZOFRAN ODT) 4 MG DISINTEGRATING TABLET    Take 1 tablet (4 mg total) by mouth every 8 (eight) hours as needed for nausea or vomiting.   OXYCODONE-ACETAMINOPHEN (ROXICET) 5-325 MG TABLET    Take 1 tablet by mouth every 4 (four) hours as needed for severe pain.     Note:   This document was prepared using Dragon voice recognition software and may include unintentional dictation errors.    Paulette Blanch, MD 12/18/16 [phone]

## 2016-12-18 LAB — URINALYSIS, COMPLETE (UACMP) WITH MICROSCOPIC
BACTERIA UA: NONE SEEN
BILIRUBIN URINE: NEGATIVE
Glucose, UA: NEGATIVE mg/dL
HGB URINE DIPSTICK: NEGATIVE
Ketones, ur: NEGATIVE mg/dL
LEUKOCYTES UA: NEGATIVE
NITRITE: NEGATIVE
Protein, ur: NEGATIVE mg/dL
SPECIFIC GRAVITY, URINE: 1.025 (ref 1.005–1.030)
Squamous Epithelial / LPF: NONE SEEN
pH: 6 (ref 5.0–8.0)

## 2016-12-18 MED ORDER — OXYCODONE-ACETAMINOPHEN 5-325 MG PO TABS
1.0000 | ORAL_TABLET | Freq: Once | ORAL | Status: AC
  Administered 2016-12-18: 1 via ORAL
  Filled 2016-12-18: qty 1

## 2016-12-18 MED ORDER — ONDANSETRON 4 MG PO TBDP
4.0000 mg | ORAL_TABLET | Freq: Once | ORAL | Status: AC
  Administered 2016-12-18: 4 mg via ORAL
  Filled 2016-12-18: qty 1

## 2016-12-18 MED ORDER — ONDANSETRON 4 MG PO TBDP: 4.0000 mg | ORAL_TABLET | Freq: Three times a day (TID) | ORAL | 0 refills | Status: DC | PRN

## 2016-12-18 MED ORDER — OXYCODONE-ACETAMINOPHEN 5-325 MG PO TABS: 1.0000 | ORAL_TABLET | ORAL | 0 refills | Status: DC | PRN

## 2016-12-18 NOTE — Discharge Instructions (Signed)
1. You may take pain and nausea medicines as needed (Percocet/Zofran). 2. Drink plenty of fluids daily. 3. Your aorta measures 2.7 cm. Your PCP will want to monitor this yearly with ultrasound. You also had an incidental finding on CT scan of fatty tumor on your right adrenal gland. 4. Return to the ER for worsening symptoms, persistent vomiting, difficult breathing or other concerns.

## 2017-02-08 DIAGNOSIS — N200 Calculus of kidney: Secondary | ICD-10-CM | POA: Insufficient documentation

## 2017-02-08 HISTORY — DX: Calculus of kidney: N20.0

## 2017-05-16 ENCOUNTER — Ambulatory Visit (INDEPENDENT_AMBULATORY_CARE_PROVIDER_SITE_OTHER): Payer: Federal, State, Local not specified - PPO

## 2017-05-16 ENCOUNTER — Ambulatory Visit: Payer: Federal, State, Local not specified - PPO | Admitting: Podiatry

## 2017-05-16 ENCOUNTER — Encounter: Payer: Self-pay | Admitting: Podiatry

## 2017-05-16 DIAGNOSIS — M722 Plantar fascial fibromatosis: Secondary | ICD-10-CM

## 2017-05-16 DIAGNOSIS — Q6650 Congenital pes planus, unspecified foot: Secondary | ICD-10-CM | POA: Diagnosis not present

## 2017-05-16 NOTE — Progress Notes (Signed)
   Subjective:    Patient ID: Clifford Ramirez, male    DOB: 10-Nov-1955, 61 y.o.   MRN: [id number]  HPIthis patient presents the office with chief complaint of sharp, radiating pain in his right heel.  He states he's been experiencing this pain for approximately 3 months.  He says he has pain upon rising in the morning and standing from a sitting position.  He also says that he has no pain as he exercises but experiences pain after he finishes exercising.  He says his pain is approximately 4 out of 10.  He says he is not provided any self treatment for professional treatment for this condition.  He presents the office today for an evaluation and treatment of his painful right heel.      Review of Systems  Musculoskeletal: Positive for gait problem.       Objective:   Physical Exam General Appearance  Alert, conversant and in no acute stress.  Vascular  Dorsalis pedis and posterior pulses are palpable  bilaterally.  Capillary return is within normal limits  Bilaterally. Temperature is within normal limits  Bilaterally  Neurologic  Senn-Weinstein monofilament wire test within normal limits  bilaterally. Muscle power  Within normal limits bilaterally.  Nails normal nails noted with no evidence of bacterial or fungal infection  Orthopedic  No limitations of motion of motion feet bilaterally.  No crepitus or effusions noted.  No bony pathology or digital deformities noted. Palpable pain noted at the insertion of the medial tuberosity of the plantar fascia right heel.  Patient has a flexible pes planus with severe pronation.  Marland Kitchen Upon weight-bearing prominent exostosis at the level of the navicular bilaterally.  DJD MCJ  B/L.  Skin  normotropic skin with no porokeratosis noted bilaterally.  No signs of infections or ulcers noted.          Assessment & Plan:  Plantar fascitis right heel.  Pes planus  B/L.   IE  Xrays reveal calcification at insertion plantar fascia.  Gorilloid navicular noted.   atient has a flexible pes planus which has led to an acute plantar fascial strain.  After x-rays were reviewed He was dispensed power step insoles andgiven a prescription for Mobic by mouth.  He is to return the office in 4 weeks for further evaluation and treatment.   Gardiner Barefoot DPM

## 2017-05-16 NOTE — Progress Notes (Signed)
Patient ID: Clifford Ramirez, male   DOB: 1956-02-12, 61 y.o.   MRN: [id number]

## 2017-05-19 ENCOUNTER — Telehealth: Payer: Self-pay | Admitting: Podiatry

## 2017-05-19 MED ORDER — MELOXICAM 15 MG PO TABS: 15.0000 mg | ORAL_TABLET | Freq: Every day | ORAL | 3 refills | Status: DC

## 2017-05-19 NOTE — Addendum Note (Signed)
Addended by: Graceann Congress D on: 05/19/2017 11:19 AM   Modules accepted: Orders

## 2017-05-19 NOTE — Telephone Encounter (Signed)
Returned patient call, notified him that rx has already been sent to pharmacy and he can pick up this afternoon

## 2017-05-19 NOTE — Telephone Encounter (Signed)
I saw Dr. Prudence Davidson on Monday and he said he was going to prescribe me some meloxicam but he did not give me a prescription. If you could call that in to the [facility] in [place]. My call back number is [phone]. Thank you.

## 2017-06-13 ENCOUNTER — Encounter: Payer: Self-pay | Admitting: Podiatry

## 2017-06-13 ENCOUNTER — Ambulatory Visit: Payer: Federal, State, Local not specified - PPO | Admitting: Podiatry

## 2017-06-13 DIAGNOSIS — Q6650 Congenital pes planus, unspecified foot: Secondary | ICD-10-CM

## 2017-06-13 DIAGNOSIS — M722 Plantar fascial fibromatosis: Secondary | ICD-10-CM | POA: Diagnosis not present

## 2017-06-13 NOTE — Progress Notes (Signed)
This patient presents the office follow-up for diagnosis of plantar fasciitis right heel. Due to of flat foot.  Patient was seen in this office approximately 4 weeks ago and treated with multiple diabetic and a power step insoles.    He says that his foot is 70% improved and the pain that he is experiencing in the morning is diminished.  He also says he is having less pain in the evening.  He says he played golf one day  without his orthotics and was in marked pain until he started using his power step insoles again.  E is very pleased with his progress.   General Appearance  Alert, conversant and in no acute stress.  Vascular  Dorsalis pedis and posterior pulses are palpable  bilaterally.  Capillary return is within normal limits  bilaterally. Temperature is within normal limits  Bilaterally.  Neurologic  Senn-Weinstein monofilament wire test within normal limits  bilaterally. Muscle power within normal limits bilaterally.  Nails Thick disfigured discolored nails with subungual debris bilaterally from hallux to fifth toes bilaterally. No evidence of bacterial infection or drainage bilaterally.  Orthopedic  No limitations of motion of motion feet bilaterally.  No crepitus or effusions noted.  Diminished palpable pain at the insertion of the plantar fascia right foot.  DJD first [facility] J right.    Skin  normotropic skin with no porokeratosis noted bilaterally.  No signs of infections or ulcers noted.    Plantar fascitis right foot.  Patient was instructed to wear his power step insoles and take Mobic as needed.  We discussed prescribing orthotics measured by Liliane Channel and patient says he will make an appointment later   Gardiner Barefoot DPM

## 2017-08-08 DIAGNOSIS — R0989 Other specified symptoms and signs involving the circulatory and respiratory systems: Secondary | ICD-10-CM | POA: Insufficient documentation

## 2018-08-21 ENCOUNTER — Ambulatory Visit
Admission: EM | Admit: 2018-08-21 | Discharge: 2018-08-21 | Disposition: A | Discharge status: Home / Self Care | Payer: Federal, State, Local not specified - PPO | Attending: Family Medicine | Admitting: Family Medicine

## 2018-08-21 ENCOUNTER — Other Ambulatory Visit: Payer: Self-pay

## 2018-08-21 ENCOUNTER — Encounter: Payer: Self-pay | Admitting: Emergency Medicine

## 2018-08-21 DIAGNOSIS — J00 Acute nasopharyngitis [common cold]: Secondary | ICD-10-CM

## 2018-08-21 NOTE — Discharge Instructions (Signed)
Rest, fluids, over the counter cold/cough medications

## 2018-08-21 NOTE — ED Provider Notes (Signed)
[facility]    CSN: [id number] Arrival date & time: 08/21/18  1884     History   Chief Complaint Chief Complaint  Patient presents with  . Cough    HPI Clifford Ramirez is a 63 y.o. male.   The history is provided by the patient.  Cough  Associated symptoms: rhinorrhea   Associated symptoms: no ear pain, no fever, no headaches, no myalgias and no wheezing   URI  Presenting symptoms: cough and rhinorrhea   Presenting symptoms: no congestion, no ear pain, no facial pain, no fatigue and no fever   Severity:  Moderate Onset quality:  Sudden Duration:  4 days Timing:  Constant Progression:  Worsening Chronicity:  New Relieved by:  None tried Ineffective treatments:  None tried Associated symptoms: no headaches, no myalgias and no wheezing   Risk factors: sick contacts     Past Medical History:  Diagnosis Date  . Hyperlipidemia   . Hypertension   . Pre-diabetes     Patient Active Problem List   Diagnosis Date Noted  . Nephrolithiasis 02/08/2017  . Hyperlipidemia, unspecified 01/26/2016  . Hypertension 01/26/2016  . Prediabetes 01/26/2016    Past Surgical History:  Procedure Laterality Date  . COLONOSCOPY WITH PROPOFOL N/A 06/28/2016   Procedure: COLONOSCOPY WITH PROPOFOL;  Surgeon: Lollie Sails, MD;  Location: [facility] ENDOSCOPY;  Service: Endoscopy;  Laterality: N/A;  . colonscopy    . KNEE SURGERY     Left  . ligation of varicosity in scrotum    . TONSILLECTOMY         Home Medications    Prior to Admission medications   Medication Sig Start Date End Date Taking? Authorizing Provider  aspirin EC 81 MG tablet Take by mouth.   Yes [provider]  atorvastatin (LIPITOR) 10 MG tablet Take by mouth. 08/02/16 08/21/18 Yes [provider]  Coenzyme Q10 (COQ-10) 100 MG CAPS Take by mouth.   Yes [provider]  hydrochlorothiazide (MICROZIDE) 12.5 MG capsule  07/04/18  Yes [provider]  losartan (COZAAR) 50 MG  tablet  07/04/18  Yes [provider]  metFORMIN (GLUCOPHAGE) 500 MG tablet Take by mouth daily.    Yes [provider]  Multiple Vitamin (MULTIVITAMIN) tablet Take by mouth.   Yes [provider]  tamsulosin (FLOMAX) 0.4 MG CAPS capsule Take by mouth. 02/06/18 02/06/19 Yes [provider]  cyclobenzaprine (FLEXERIL) 10 MG tablet Take 1 tablet (10 mg total) by mouth 2 (two) times daily as needed for muscle spasms (do not drive or operate machinery while taking). 12/22/15   Marylene Land, NP  Docosahexaenoic Acid (DHA OMEGA 3 PO) Take 1,000 mg by mouth.    [provider]  DOCOSAHEXAENOIC ACID PO Take by mouth.    [provider]  losartan-hydrochlorothiazide Konrad Penta) 50-12.5 MG tablet Take by mouth. 08/10/16 08/21/18  [provider]  meloxicam (MOBIC) 15 MG tablet Take 1 tablet (15 mg total) by mouth daily. 05/19/17   Gardiner Barefoot, DPM  metFORMIN (GLUCOPHAGE) 500 MG tablet Take by mouth. 08/02/16 08/02/17  [provider]  oxyCODONE-acetaminophen (ROXICET) 5-325 MG tablet Take 1 tablet by mouth every 4 (four) hours as needed for severe pain. 12/18/16   Paulette Blanch, MD    Family History Family History  Problem Relation Age of Onset  . Cancer Father     Social History Social History   Tobacco Use  . Smoking status: Former Research scientist (life sciences)  . Smokeless tobacco: Never Used  Substance Use Topics  . Alcohol use: Yes  . Drug use: No     Allergies   Patient has no known allergies.   Review of Systems Review of Systems  Constitutional: Negative for fatigue and fever.  HENT: Positive for rhinorrhea. Negative for congestion and ear pain.   Respiratory: Positive for cough. Negative for wheezing.   Musculoskeletal: Negative for myalgias.  Neurological: Negative for headaches.     Physical Exam Triage Vital Signs ED Triage Vitals  Enc Vitals Group     BP 08/21/18 1003 114/87     Pulse Rate 08/21/18 1003 92     Resp 08/21/18  1003 18     Temp 08/21/18 1003 98.2 F (36.8 C)     Temp Source 08/21/18 1003 Oral     SpO2 08/21/18 1003 96 %     Weight 08/21/18 0957 226 lb (102.5 kg)     Height 08/21/18 0957 6\' 2"  (1.88 m)     Head Circumference --      Peak Flow --      Pain Score 08/21/18 0957 0     Pain Loc --      Pain Edu? --      Excl. in [place]? --    No data found.  Updated Vital Signs BP 114/87 (BP Location: Left Arm)   Pulse 92   Temp 98.2 F (36.8 C) (Oral)   Resp 18   Ht 6\' 2"  (1.88 m)   Wt 102.5 kg   SpO2 96%   BMI 29.02 kg/m   Visual Acuity Right Eye Distance:   Left Eye Distance:   Bilateral Distance:    Right Eye Near:   Left Eye Near:    Bilateral Near:     Physical Exam Vitals signs and nursing note reviewed.  Constitutional:      General: He is not in acute distress.    Appearance: He is well-developed. He is not ill-appearing, toxic-appearing or diaphoretic.  HENT:     Head: Normocephalic and atraumatic.     Right Ear: Tympanic membrane, ear canal and external ear normal.     Left Ear: Tympanic membrane, ear canal and external ear normal.     Nose: Rhinorrhea present.     Mouth/Throat:     Pharynx: Uvula midline. No oropharyngeal exudate.     Tonsils: No tonsillar abscesses.  Eyes:     General: No scleral icterus.       Right eye: No discharge.        Left eye: No discharge.  Neck:     Musculoskeletal: Normal range of motion and neck supple.     Thyroid: No thyromegaly.     Trachea: No tracheal deviation.  Cardiovascular:     Rate and Rhythm: Normal rate and regular rhythm.     Heart sounds: Normal heart sounds.  Pulmonary:     Effort: Pulmonary effort is normal. No respiratory distress.     Breath sounds: Normal breath sounds. No stridor. No wheezing, rhonchi or rales.  Chest:     Chest wall: No tenderness.  Lymphadenopathy:     Cervical: No cervical adenopathy.  Skin:    General: Skin is warm and dry.     Findings: No rash.  Neurological:     Mental  Status: He is alert.      UC Treatments / Results  Labs (all labs ordered are listed, but only abnormal results are displayed) Labs Reviewed - No data to display  EKG  None  Radiology No results found.  Procedures Procedures (including critical care time)  Medications Ordered in UC Medications - No data to display  Initial Impression / Assessment and Plan / UC Course  I have reviewed the triage vital signs and the nursing notes.  Pertinent labs & imaging results that were available during my care of the patient were reviewed by me and considered in my medical decision making (see chart for details).      Final Clinical Impressions(s) / UC Diagnoses   Final diagnoses:  Common cold     Discharge Instructions     Rest, fluids, over the counter cold/cough medications    ED Prescriptions    None      1. diagnosis reviewed with patient 2. rx as per orders above; reviewed possible side effects, interactions, risks and benefits  3. Recommend supportive treatment as above 4. Follow-up prn if symptoms worsen or don't improve  Controlled Substance Prescriptions [place] Controlled Substance Registry consulted? Not Applicable   Norval Gable, MD 08/21/18 1144

## 2018-08-21 NOTE — ED Triage Notes (Signed)
Pt c/o cough, post nasal drainage, and congestion. Started about 4 days ago. He works at the [employer] and is required to be seen before returning to work. Denies fever, body aches.

## 2020-01-25 DIAGNOSIS — Z85820 Personal history of malignant melanoma of skin: Secondary | ICD-10-CM | POA: Insufficient documentation

## 2020-01-25 DIAGNOSIS — Z8669 Personal history of other diseases of the nervous system and sense organs: Secondary | ICD-10-CM

## 2020-01-25 HISTORY — DX: Personal history of other diseases of the nervous system and sense organs: Z86.69

## 2020-03-24 ENCOUNTER — Other Ambulatory Visit: Payer: Self-pay | Admitting: Neurology

## 2020-03-24 DIAGNOSIS — G459 Transient cerebral ischemic attack, unspecified: Secondary | ICD-10-CM

## 2020-04-14 ENCOUNTER — Ambulatory Visit
Admission: RE | Admit: 2020-04-14 | Discharge: 2020-04-14 | Disposition: A | Discharge status: Home / Self Care | Payer: Federal, State, Local not specified - PPO | Source: Ambulatory Visit | Attending: Neurology | Admitting: Neurology

## 2020-04-14 ENCOUNTER — Other Ambulatory Visit: Payer: Self-pay

## 2020-04-14 DIAGNOSIS — G459 Transient cerebral ischemic attack, unspecified: Secondary | ICD-10-CM

## 2020-04-14 IMAGING — MR MR MRA HEAD W/O CM
1 series · 19 of 48 positions shown · non-contrast
Comparison: None.

CLINICAL DATA: Sudden episode of dizziness. Blurred vision.
Possible TIA.



[Series 5: TOF · axial · 0.5mm · 0.41mm/px · z∈[-85,+12]mm · 19 of 205 slices shown]
[im 1/205]
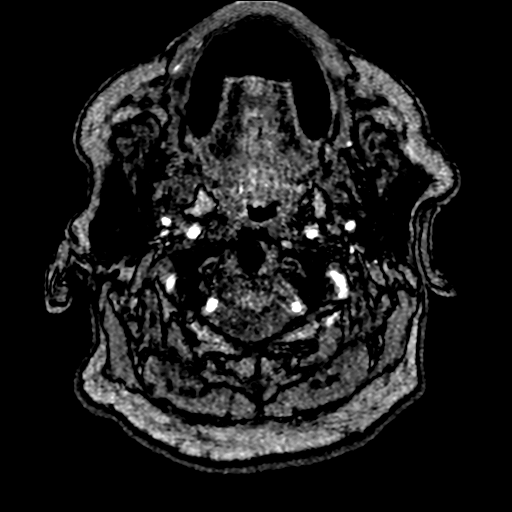
[im 5/205]
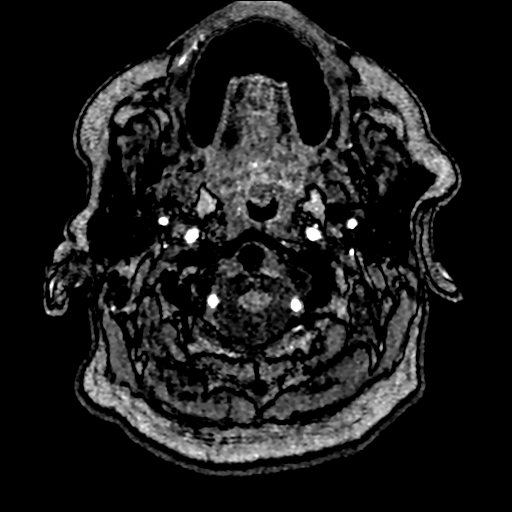
[im 9/205]
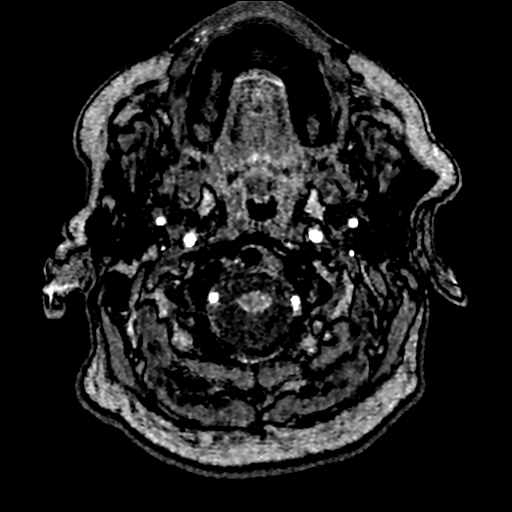
[im 14/205]
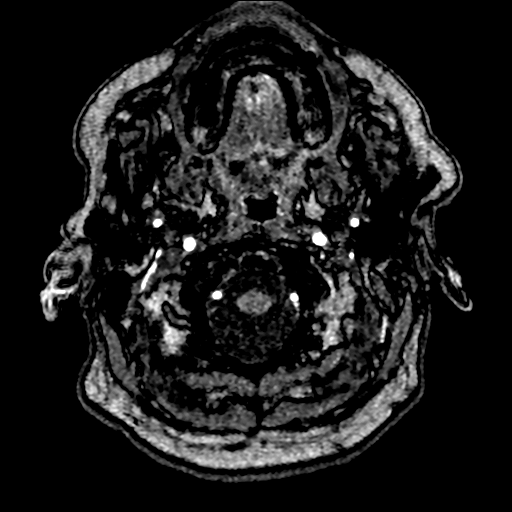
[im 18/205]
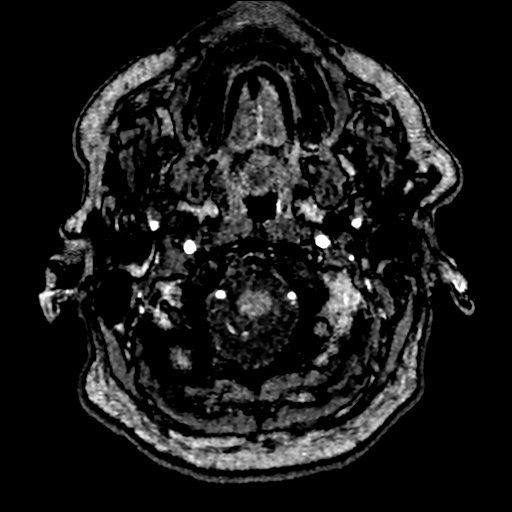
[im 22/205]
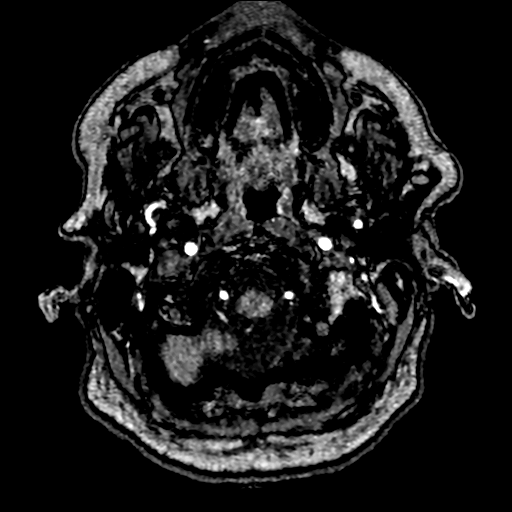
[im 27/205]
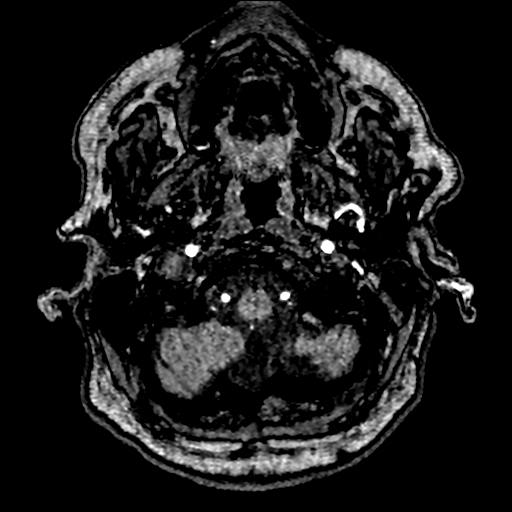
[im 31/205]
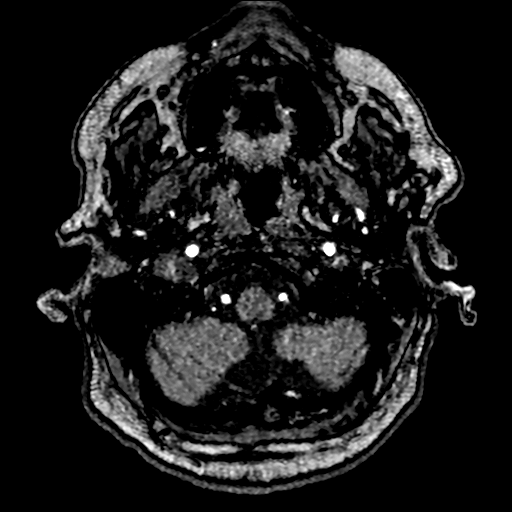
[im 35/205]
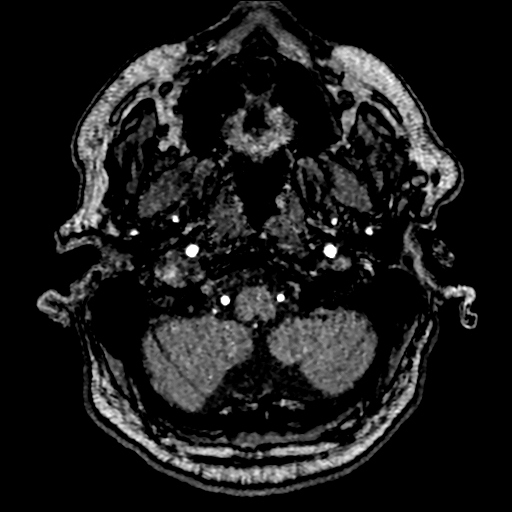
[im 40/205]
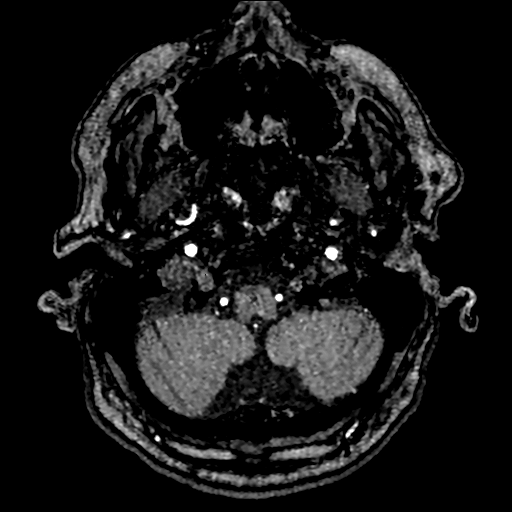
[im 44/205]
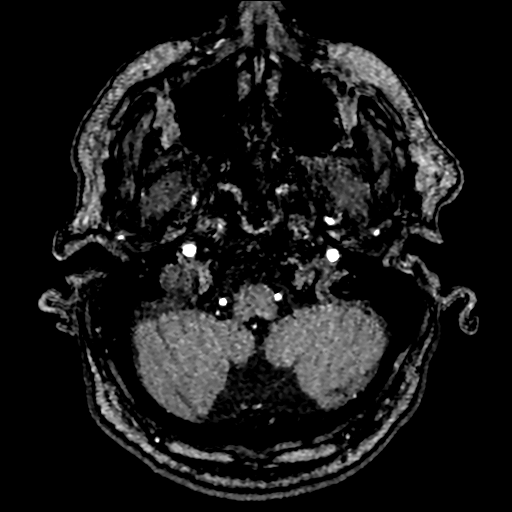
[im 66/205]
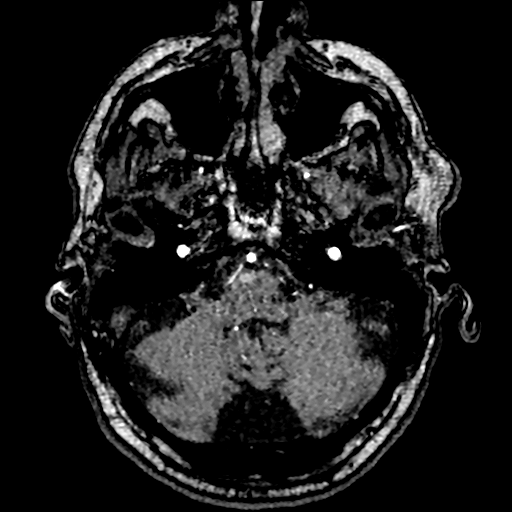
[im 92/205]
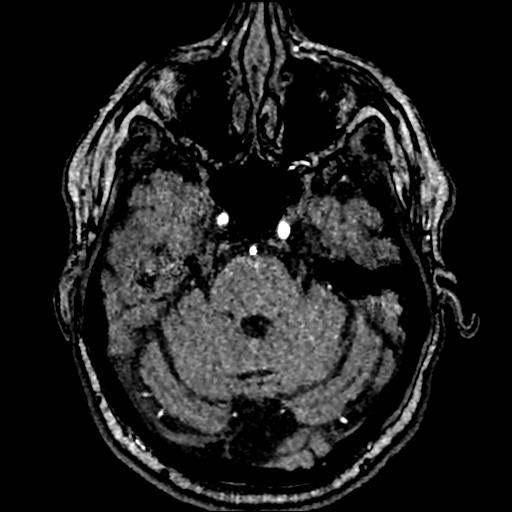
[im 105/205]
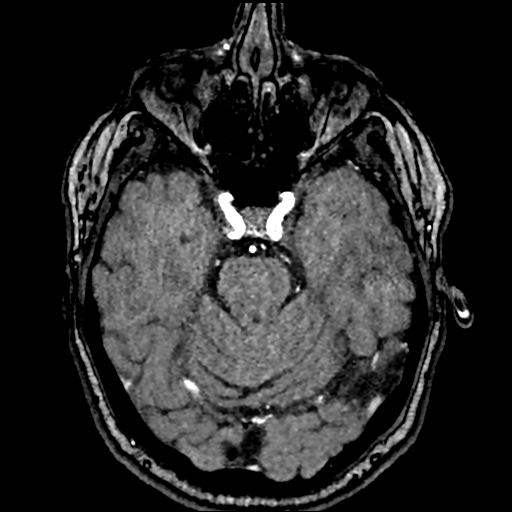
[im 118/205]
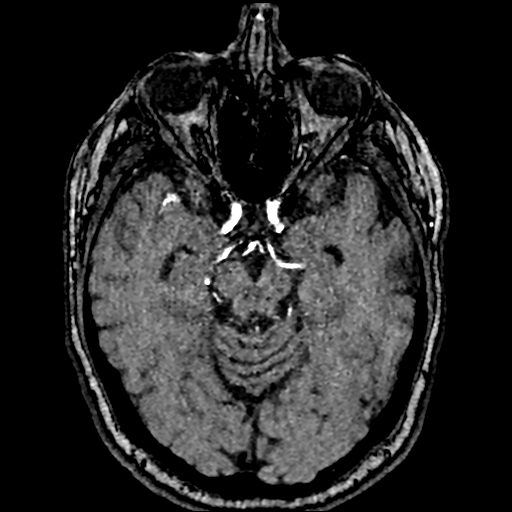
[im 144/205]
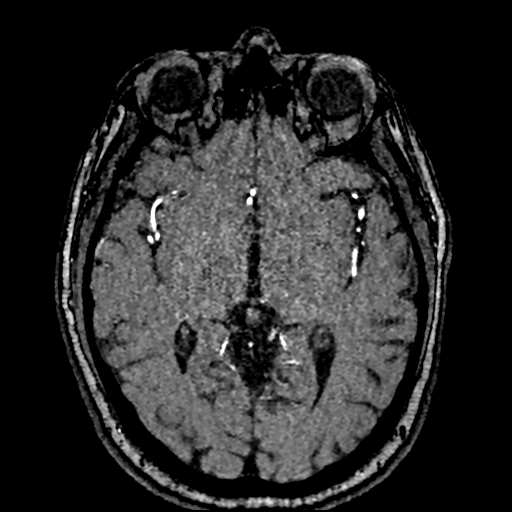
[im 170/205]
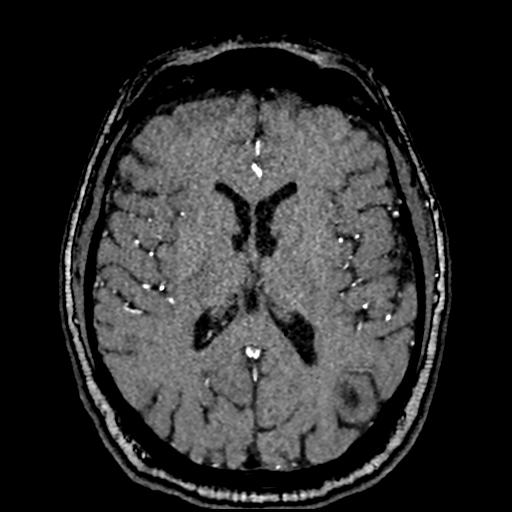
[im 174/205]
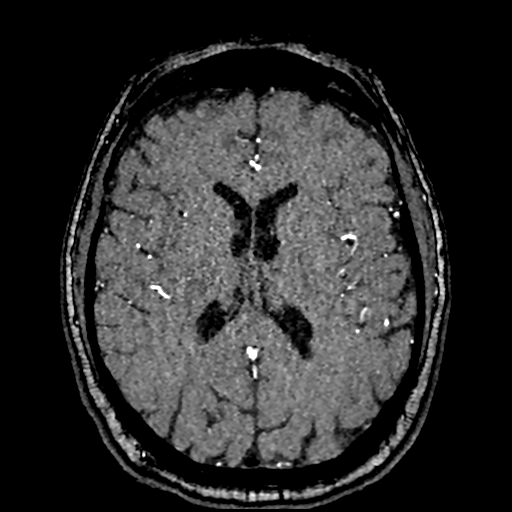
[im 196/205]
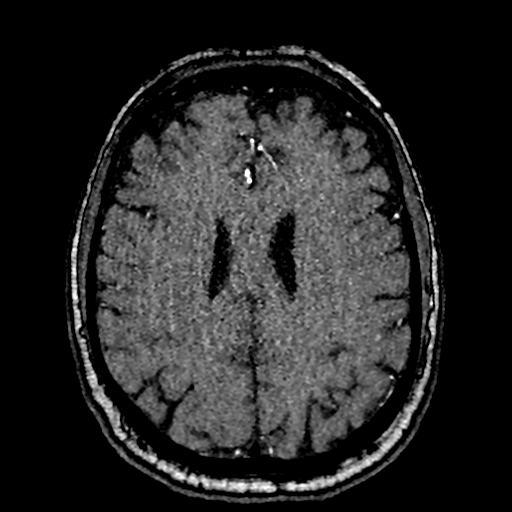

[19 of 48 positions shown; findings below may reference images not displayed]

FINDINGS: MRI HEAD FINDINGS

Brain: No acute infarction, hemorrhage, hydrocephalus, extra-axial
collection or mass lesion. Retro cerebellar CSF collection which
extends to the torcula, likely an arachnoid cyst. No substantial
mass effect. Fourth ventricle is widely patent.

Skull and upper cervical spine: Normal marrow signal.

Sinuses/Orbits: The sinuses are clear.  Unremarkable orbits.

Other: Trace bilateral mastoid effusions.

MRA NECK FINDINGS

Nondiagnostic evaluation of the great vessel origins and proximal
vessels in the neck given patient motion. Within this limitation, no
evidence of hemodynamically significant stenosis in the neck.
Codominant vertebral arteries.

MRA HEAD FINDINGS

No evidence of hemodynamically significant proximal stenosis or
large vessel occlusion. Left fetal type PCA. No evidence of aneurysm
or vascular malformation.
IMPRESSION: 1. No acute intracranial abnormality. Specifically, no acute
infarct.
2. Nondiagnostic evaluation of the great vessel origins and proximal
vessels in the neck secondary to patient motion. Within this
limitation, no evidence of hemodynamically significant stenosis or
large vessel occlusion in the head or neck.

## 2020-04-14 IMAGING — MR MR MRA NECK W/O CM
2 of 3 series · 12 of 48 positions shown · non-contrast
Comparison: None.

CLINICAL DATA: Sudden episode of dizziness. Blurred vision.
Possible TIA.



[Series 12: angio_fl3d_cor_pre_ttc=2.0s · coronal · 0.9mm · 0.85mm/px · 11 of 96 slices shown]
[im 1/96]
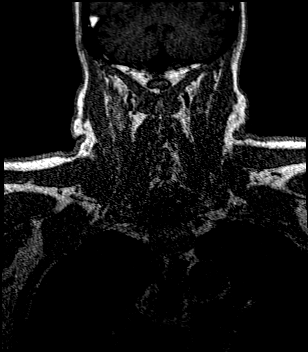
[im 10/96]
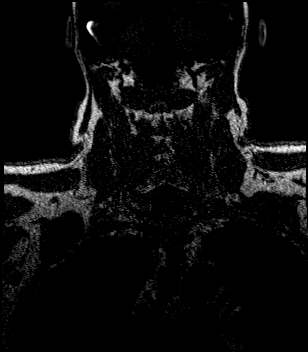
[im 20/96]
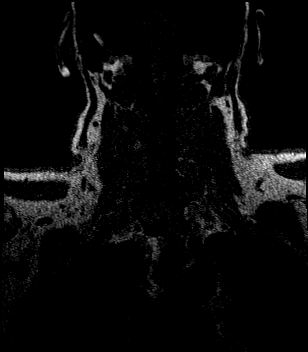
[im 29/96]
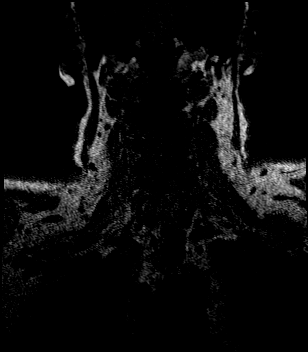
[im 39/96]
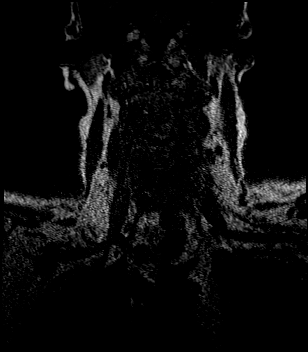
[im 48/96]
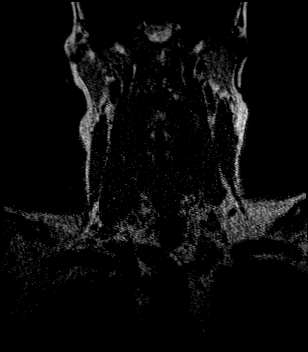
[im 58/96]
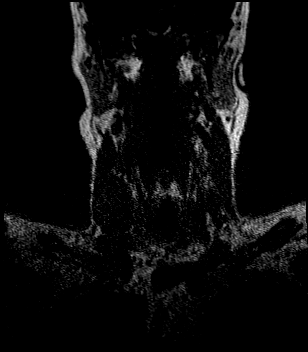
[im 67/96]
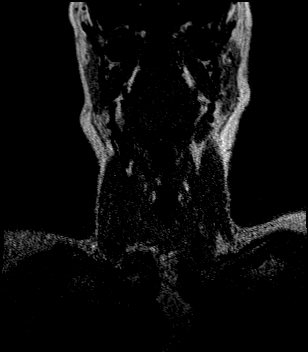
[im 77/96]
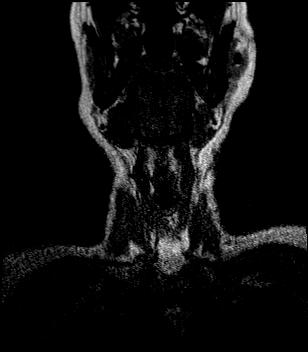
[im 86/96]
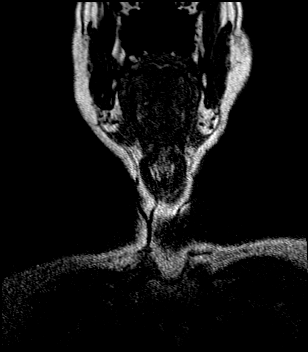
[im 96/96]
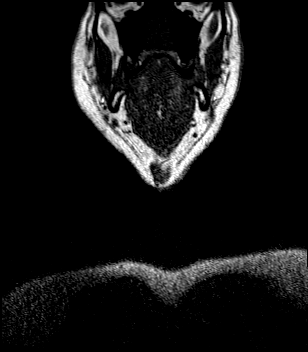

[Series 1087: verts · coronal · 0.5mm · 0.43mm/px · 1 of 8 slices shown]
[im 1/8]
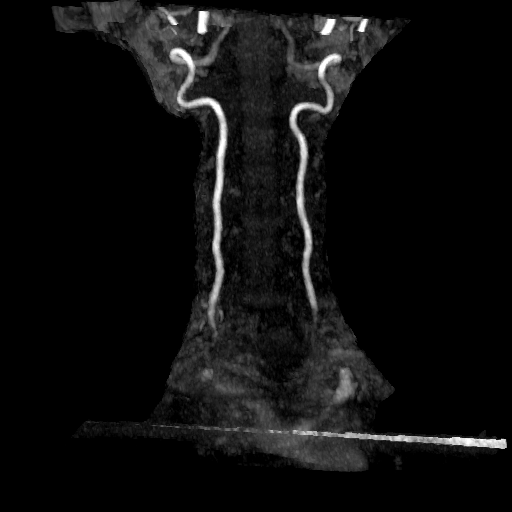

[12 of 48 positions shown; findings below may reference images not displayed]

FINDINGS: MRI HEAD FINDINGS

Brain: No acute infarction, hemorrhage, hydrocephalus, extra-axial
collection or mass lesion. Retro cerebellar CSF collection which
extends to the torcula, likely an arachnoid cyst. No substantial
mass effect. Fourth ventricle is widely patent.

Skull and upper cervical spine: Normal marrow signal.

Sinuses/Orbits: The sinuses are clear.  Unremarkable orbits.

Other: Trace bilateral mastoid effusions.

MRA NECK FINDINGS

Nondiagnostic evaluation of the great vessel origins and proximal
vessels in the neck given patient motion. Within this limitation, no
evidence of hemodynamically significant stenosis in the neck.
Codominant vertebral arteries.

MRA HEAD FINDINGS

No evidence of hemodynamically significant proximal stenosis or
large vessel occlusion. Left fetal type PCA. No evidence of aneurysm
or vascular malformation.
IMPRESSION: 1. No acute intracranial abnormality. Specifically, no acute
infarct.
2. Nondiagnostic evaluation of the great vessel origins and proximal
vessels in the neck secondary to patient motion. Within this
limitation, no evidence of hemodynamically significant stenosis or
large vessel occlusion in the head or neck.

## 2020-04-14 IMAGING — MR MR HEAD W/O CM
12 series · 43 of 48 positions shown · non-contrast
Comparison: None.

CLINICAL DATA: Sudden episode of dizziness. Blurred vision.
Possible TIA.



[Series 5: ax dwi_tracew · axial · 3.0mm · 0.60mm/px · z∈[-92,+62]mm · 4 of 48 slices shown]
[im 1/48]
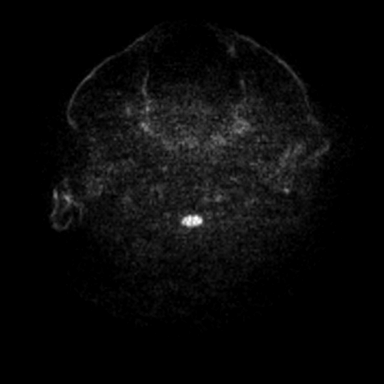
[im 16/48]
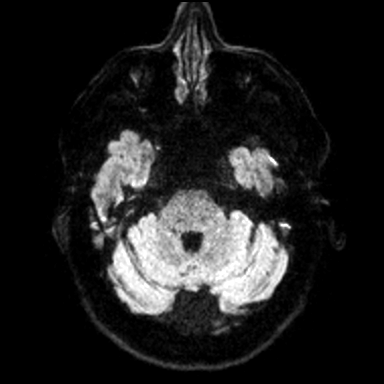
[im 32/48]
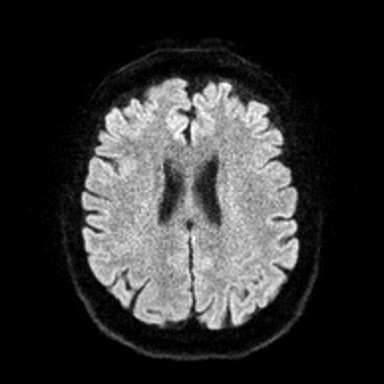
[im 48/48]
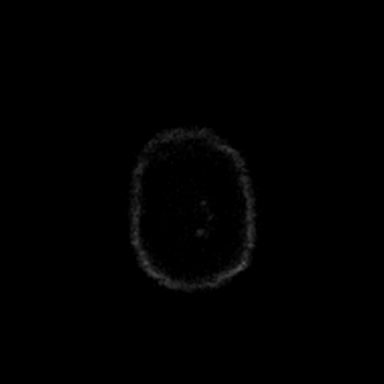

[Series 6: ax dwi_adc · axial · 3.0mm · 0.60mm/px · z∈[-92,+62]mm · 3 of 48 slices shown]
[im 1/48]
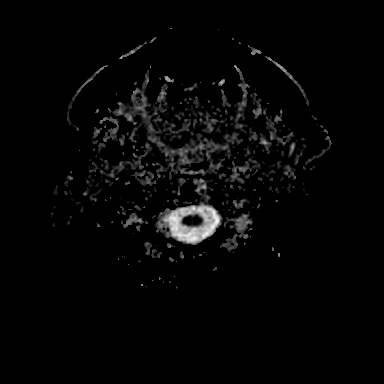
[im 24/48]
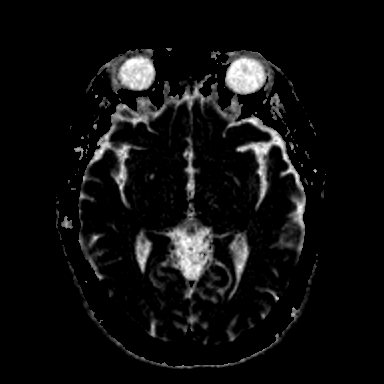
[im 48/48]
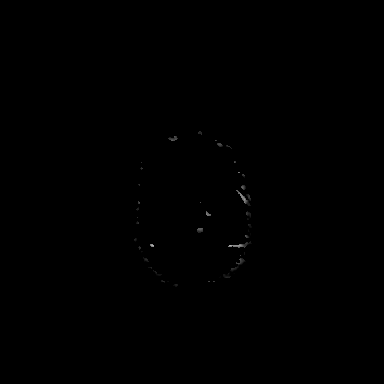

[Series 11: cor dwi_tracew · coronal · 5.0mm · 0.60mm/px · 3 of 40 slices shown]
[im 1/40]
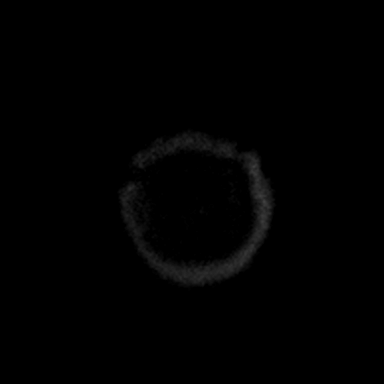
[im 20/40]
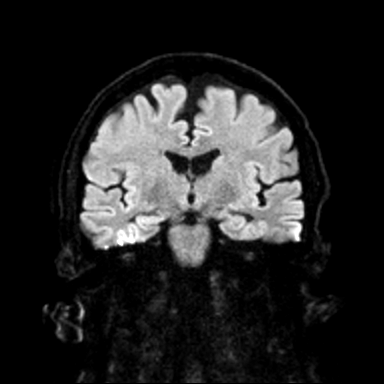
[im 40/40]
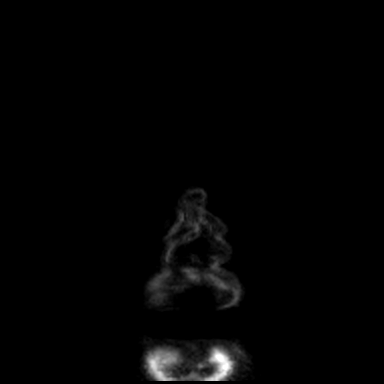

[Series 12: cor dwi_adc · coronal · 5.0mm · 0.60mm/px · 3 of 40 slices shown]
[im 1/40]
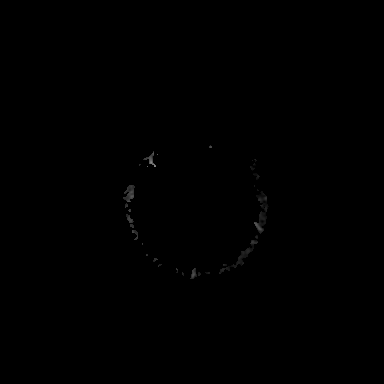
[im 20/40]
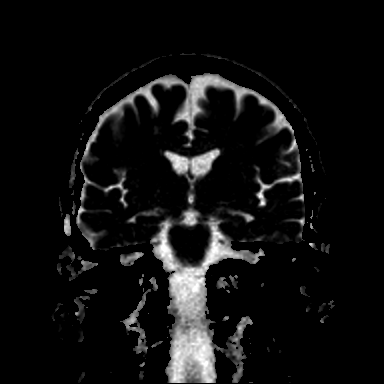
[im 40/40]
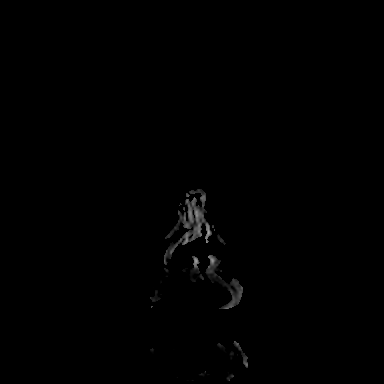

[Series 16: T1 · sagittal · 5.0mm · 0.62mm/px · 2 of 25 slices shown (1 of 2)]
[im 1/25]
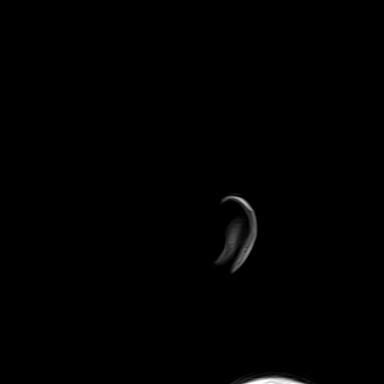
[im 25/25]
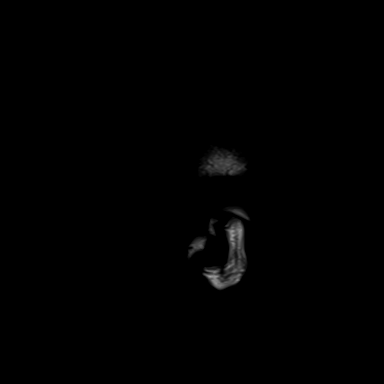

[Series 17: T2 · axial · 5.0mm · 0.53mm/px · z∈[-93,+62]mm · 2 of 27 slices shown (1 of 2)]
[im 1/27]
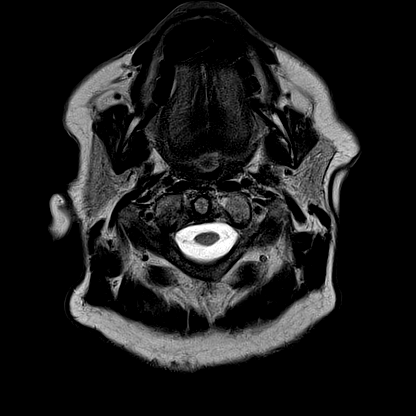
[im 27/27]
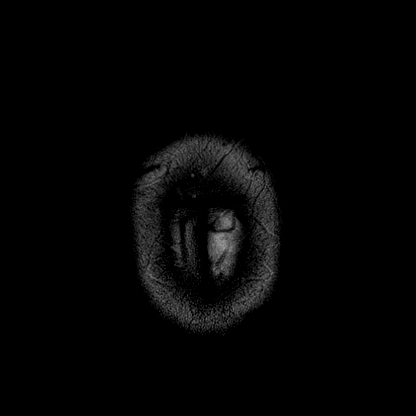

[Series 18: mag_images · axial · 3.0mm · 0.90mm/px · z∈[-104,+73]mm · 4 of 60 slices shown]
[im 1/60]
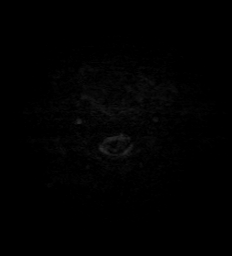
[im 20/60]
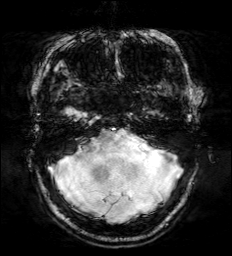
[im 40/60]
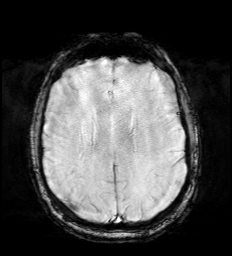
[im 60/60]
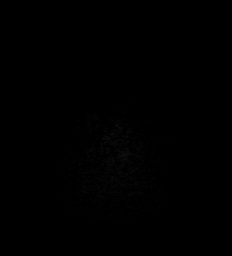

[Series 19: pha_images · axial · 3.0mm · 0.90mm/px · z∈[-104,+73]mm · 4 of 60 slices shown]
[im 1/60]
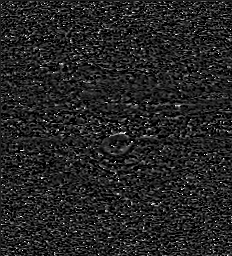
[im 20/60]
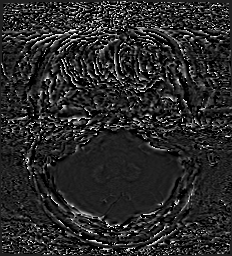
[im 40/60]
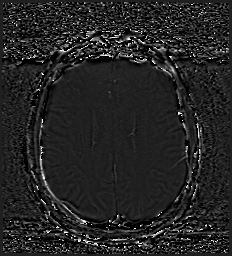
[im 60/60]
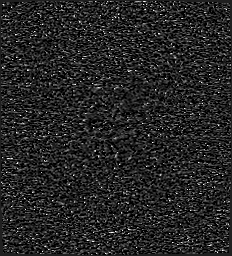

[Series 20: swi_images · axial · 3.0mm · 0.90mm/px · z∈[-104,+73]mm · 4 of 60 slices shown]
[im 1/60]
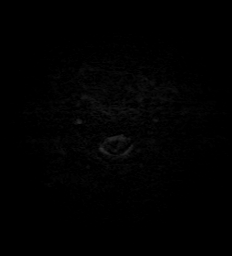
[im 20/60]
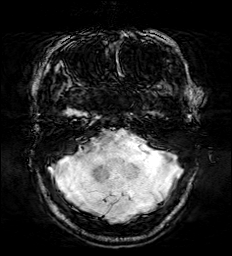
[im 40/60]
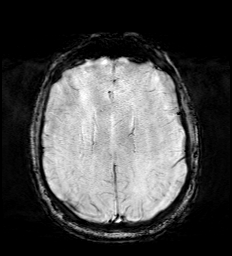
[im 60/60]
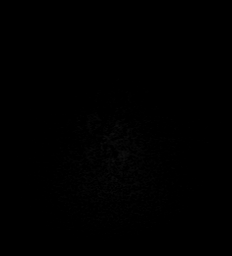

[Series 22: FLAIR · axial · 3.0mm · 0.53mm/px · z∈[-96,+65]mm · 4 of 55 slices shown]
[im 1/55]
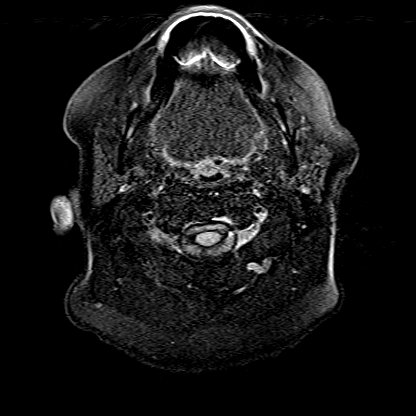
[im 19/55]
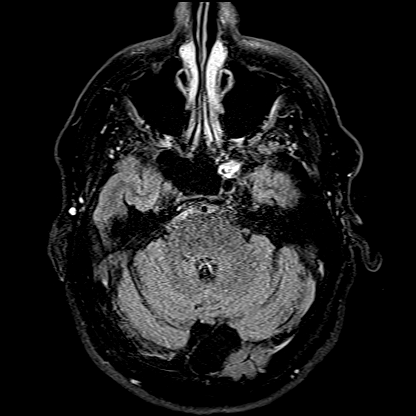
[im 37/55]
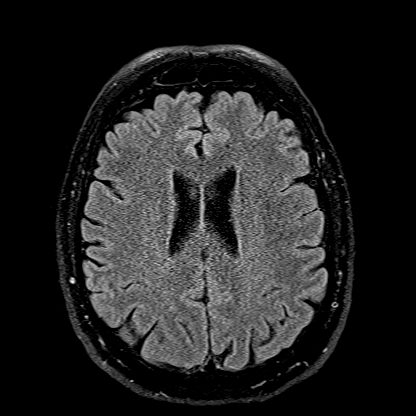
[im 55/55]
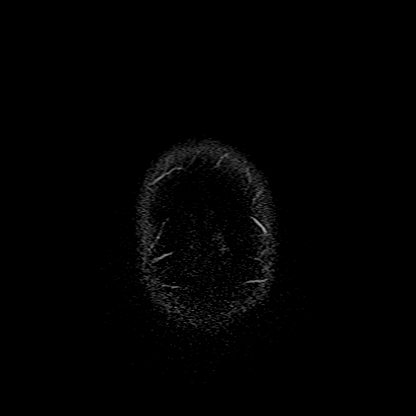

[Series 23: T1 · axial · 1.0mm · 0.98mm/px · z∈[-101,+73]mm · 8 of 176 slices shown (2 of 2)]
[im 1/176]
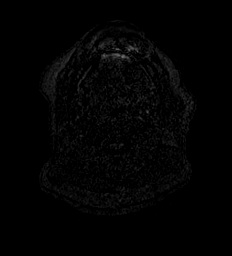
[im 30/176]
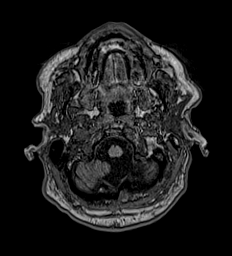
[im 59/176]
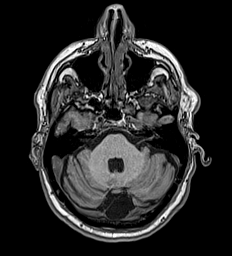
[im 73/176]
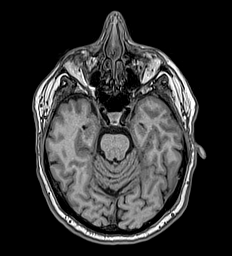
[im 103/176]
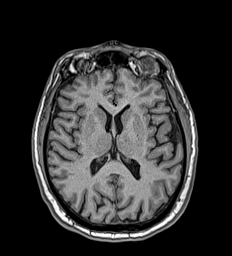
[im 117/176]
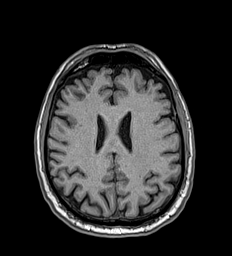
[im 146/176]
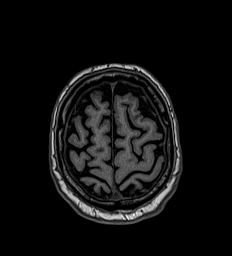
[im 176/176]
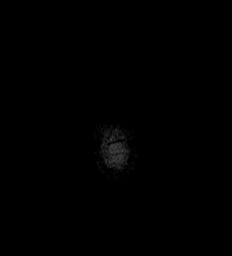

[Series 24: T2 · coronal · 5.0mm · 0.57mm/px · 2 of 29 slices shown (2 of 2)]
[im 1/29]
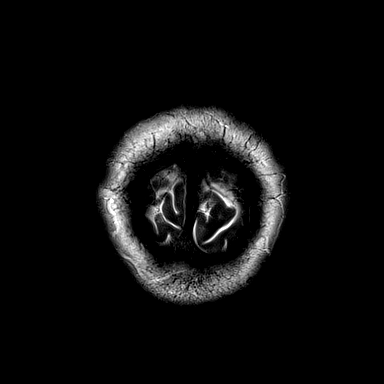
[im 29/29]
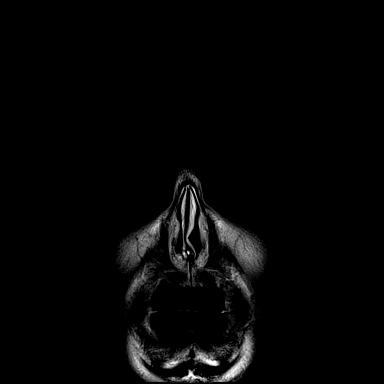

[43 of 48 positions shown; findings below may reference images not displayed]

FINDINGS: MRI HEAD FINDINGS

Brain: No acute infarction, hemorrhage, hydrocephalus, extra-axial
collection or mass lesion. Retro cerebellar CSF collection which
extends to the torcula, likely an arachnoid cyst. No substantial
mass effect. Fourth ventricle is widely patent.

Skull and upper cervical spine: Normal marrow signal.

Sinuses/Orbits: The sinuses are clear.  Unremarkable orbits.

Other: Trace bilateral mastoid effusions.

MRA NECK FINDINGS

Nondiagnostic evaluation of the great vessel origins and proximal
vessels in the neck given patient motion. Within this limitation, no
evidence of hemodynamically significant stenosis in the neck.
Codominant vertebral arteries.

MRA HEAD FINDINGS

No evidence of hemodynamically significant proximal stenosis or
large vessel occlusion. Left fetal type PCA. No evidence of aneurysm
or vascular malformation.
IMPRESSION: 1. No acute intracranial abnormality. Specifically, no acute
infarct.
2. Nondiagnostic evaluation of the great vessel origins and proximal
vessels in the neck secondary to patient motion. Within this
limitation, no evidence of hemodynamically significant stenosis or
large vessel occlusion in the head or neck.

## 2022-01-19 DIAGNOSIS — M1711 Unilateral primary osteoarthritis, right knee: Secondary | ICD-10-CM | POA: Insufficient documentation

## 2022-10-07 ENCOUNTER — Ambulatory Visit: Payer: Federal, State, Local not specified - PPO | Admitting: Family Medicine

## 2022-10-07 ENCOUNTER — Encounter: Payer: Self-pay | Admitting: Family Medicine

## 2022-10-07 VITALS — BP 100/80 | HR 69 | Temp 98.0°F | Resp 17 | Ht 73.25 in | Wt 207.5 lb

## 2022-10-07 DIAGNOSIS — E1169 Type 2 diabetes mellitus with other specified complication: Secondary | ICD-10-CM

## 2022-10-07 DIAGNOSIS — Z7984 Long term (current) use of oral hypoglycemic drugs: Secondary | ICD-10-CM

## 2022-10-07 DIAGNOSIS — Z1159 Encounter for screening for other viral diseases: Secondary | ICD-10-CM

## 2022-10-07 DIAGNOSIS — N401 Enlarged prostate with lower urinary tract symptoms: Secondary | ICD-10-CM | POA: Diagnosis not present

## 2022-10-07 DIAGNOSIS — E118 Type 2 diabetes mellitus with unspecified complications: Secondary | ICD-10-CM

## 2022-10-07 DIAGNOSIS — R7989 Other specified abnormal findings of blood chemistry: Secondary | ICD-10-CM | POA: Diagnosis not present

## 2022-10-07 DIAGNOSIS — I152 Hypertension secondary to endocrine disorders: Secondary | ICD-10-CM

## 2022-10-07 DIAGNOSIS — E1159 Type 2 diabetes mellitus with other circulatory complications: Secondary | ICD-10-CM | POA: Diagnosis not present

## 2022-10-07 DIAGNOSIS — I7143 Infrarenal abdominal aortic aneurysm, without rupture: Secondary | ICD-10-CM

## 2022-10-07 DIAGNOSIS — R351 Nocturia: Secondary | ICD-10-CM

## 2022-10-07 DIAGNOSIS — Z125 Encounter for screening for malignant neoplasm of prostate: Secondary | ICD-10-CM

## 2022-10-07 DIAGNOSIS — E785 Hyperlipidemia, unspecified: Secondary | ICD-10-CM

## 2022-10-07 LAB — CBC WITH DIFFERENTIAL/PLATELET
Basophils Absolute: 0 10*3/uL (ref 0.0–0.1)
Basophils Relative: 0.6 % (ref 0.0–3.0)
Eosinophils Absolute: 0 10*3/uL (ref 0.0–0.7)
Eosinophils Relative: 0.8 % (ref 0.0–5.0)
HCT: 52 % (ref 39.0–52.0)
Hemoglobin: 17.9 g/dL — ABNORMAL HIGH (ref 13.0–17.0)
Lymphocytes Relative: 36.6 % (ref 12.0–46.0)
Lymphs Abs: 1.5 10*3/uL (ref 0.7–4.0)
MCHC: 34.4 g/dL (ref 30.0–36.0)
MCV: 83.7 fl (ref 78.0–100.0)
Monocytes Absolute: 0.5 10*3/uL (ref 0.1–1.0)
Monocytes Relative: 12.9 % — ABNORMAL HIGH (ref 3.0–12.0)
Neutro Abs: 2.1 10*3/uL (ref 1.4–7.7)
Neutrophils Relative %: 49.1 % (ref 43.0–77.0)
Platelets: 191 10*3/uL (ref 150.0–400.0)
RBC: 6.22 Mil/uL — ABNORMAL HIGH (ref 4.22–5.81)
RDW: 14.1 % (ref 11.5–15.5)
WBC: 4.2 10*3/uL (ref 4.0–10.5)

## 2022-10-07 LAB — COMPREHENSIVE METABOLIC PANEL
ALT: 37 U/L (ref 0–53)
AST: 31 U/L (ref 0–37)
Albumin: 4.6 g/dL (ref 3.5–5.2)
Alkaline Phosphatase: 103 U/L (ref 39–117)
BUN: 21 mg/dL (ref 6–23)
CO2: 30 mEq/L (ref 19–32)
Calcium: 9.9 mg/dL (ref 8.4–10.5)
Chloride: 102 mEq/L (ref 96–112)
Creatinine, Ser: 0.96 mg/dL (ref 0.40–1.50)
GFR: 81.46 mL/min (ref >60.00)
Glucose, Bld: 109 mg/dL — ABNORMAL HIGH (ref 70–99)
Potassium: 4.5 mEq/L (ref 3.5–5.1)
Sodium: 140 mEq/L (ref 135–145)
Total Bilirubin: 0.8 mg/dL (ref 0.2–1.2)
Total Protein: 7.5 g/dL (ref 6.0–8.3)

## 2022-10-07 LAB — TSH: TSH: 1.65 u[IU]/mL (ref 0.35–5.50)

## 2022-10-07 LAB — HEMOGLOBIN A1C: Hgb A1c MFr Bld: 5.7 % (ref 4.6–6.5)

## 2022-10-07 LAB — LIPID PANEL
Cholesterol: 132 mg/dL (ref 0–200)
HDL: 42.5 mg/dL (ref >39.00)
LDL Cholesterol: 72 mg/dL (ref 0–99)
NonHDL: 89.68
Total CHOL/HDL Ratio: 3
Triglycerides: 87 mg/dL (ref 0.0–149.0)
VLDL: 17.4 mg/dL (ref 0.0–40.0)

## 2022-10-07 LAB — PSA: PSA: 0.31 ng/mL (ref 0.10–4.00)

## 2022-10-07 LAB — VITAMIN D 25 HYDROXY (VIT D DEFICIENCY, FRACTURES): VITD: 63.33 ng/mL (ref 30.00–100.00)

## 2022-10-07 LAB — VITAMIN B12: Vitamin B-12: 717 pg/mL (ref 211–911)

## 2022-10-07 MED ORDER — LOSARTAN POTASSIUM 25 MG PO TABS
25.0000 mg | ORAL_TABLET | Freq: Every day | ORAL | 1 refills | Status: DC
Start: 2022-10-07 — End: 2023-03-23

## 2022-10-07 NOTE — Patient Instructions (Addendum)
It was a pleasure meeting you today. Thank you for allowing me to take part in your health care.  Our goals for today as we discussed include:  Received/Recommend Pneumonia 20 vaccine  We will get some labs today.  If they are abnormal or we need to do something about them, I will call you.  If they are normal, I will send you a message on MyChart (if it is active) or a letter in the mail.  If you don't hear from [place] in 2 weeks, please call the office at the number below.   Follow up with Vascular surgery as scheduled.  Monitor blood pressure at home Goal less than 130/80  Follow up in 3 months  If you have any questions or concerns, please do not hesitate to call the office at [phone].  I look forward to our next visit and until then take care and stay safe.  Regards,   Dana Allan, MD   [facility]

## 2022-10-08 LAB — HEPATITIS C ANTIBODY: Hepatitis C Ab: NONREACTIVE

## 2022-10-17 ENCOUNTER — Encounter: Payer: Self-pay | Admitting: Family Medicine

## 2022-10-17 DIAGNOSIS — I714 Abdominal aortic aneurysm, without rupture, unspecified: Secondary | ICD-10-CM | POA: Insufficient documentation

## 2022-10-17 DIAGNOSIS — Z1159 Encounter for screening for other viral diseases: Secondary | ICD-10-CM | POA: Insufficient documentation

## 2022-10-17 DIAGNOSIS — E119 Type 2 diabetes mellitus without complications: Secondary | ICD-10-CM | POA: Insufficient documentation

## 2022-10-17 DIAGNOSIS — N401 Enlarged prostate with lower urinary tract symptoms: Secondary | ICD-10-CM | POA: Insufficient documentation

## 2022-10-17 DIAGNOSIS — R7989 Other specified abnormal findings of blood chemistry: Secondary | ICD-10-CM

## 2022-10-17 DIAGNOSIS — E118 Type 2 diabetes mellitus with unspecified complications: Secondary | ICD-10-CM | POA: Insufficient documentation

## 2022-10-17 DIAGNOSIS — R351 Nocturia: Secondary | ICD-10-CM | POA: Insufficient documentation

## 2022-10-17 DIAGNOSIS — Z125 Encounter for screening for malignant neoplasm of prostate: Secondary | ICD-10-CM | POA: Insufficient documentation

## 2022-10-17 HISTORY — DX: Other specified abnormal findings of blood chemistry: R79.89

## 2022-10-17 NOTE — Assessment & Plan Note (Signed)
Chronic. Check vitamin D levels

## 2022-10-17 NOTE — Assessment & Plan Note (Signed)
Chronic.  On statin therapy.  Tolerating well.  No myalgias. Continue atorvastatin 10 mg daily Check fasting lipids

## 2022-10-17 NOTE — Assessment & Plan Note (Addendum)
History AAA/carotid stenosis Appeared he had an outside ultrasound that was thought does show a 3.1 cm suprarenal AAA.  Evaluated at [facility] vascular in September 2023 and aortic duplex there showed suprarenal aorta measuring 2.8 cm and infrarenal aorta measuring 2.7 cm.  No evidence of AAA on that exam.  Recommendations to repeat carotid duplex and aortic duplex in 2-5 years.  Due 02/2024. Continue statin therapy Continue to be blood pressure less than 130/80 Avoid smoking Follow-up with vascular as scheduled

## 2022-10-17 NOTE — Assessment & Plan Note (Signed)
Chronic.  Stable.  No previous evaluation by urology. Tolerating current medications. Continue finasteride 5 mg daily Continue tamsulosin 0.8 mg daily Check PSA Referral sent urology.

## 2022-10-17 NOTE — Assessment & Plan Note (Addendum)
Chronic.  Had elevated A1c of 6.8 in past.  Well-controlled on current medication. Continue metformin 500 mg daily Continue ARB Continue statin therapy Urine ACR Recommend annual diabetic eye exam Recommend annual foot exam Check A1c, CMET, vitamin B12 level

## 2022-10-17 NOTE — Progress Notes (Signed)
SUBJECTIVE:   Chief Complaint  Patient presents with   Establish Care   HPI Patient presents to clinic to establish care.  No acute concerns today. 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  Hypertension Asymptomatic.  Prescribed Cozaar 50 mg twice daily.  Reports does not take medication as prescribed and only if blood pressure is elevated.  Typically takes half a tablet daily.  Blood pressures at home remain less than 120/80 and usually run low 110s/70s.   Diabetes type 2 Asymptomatic.  Takes metformin 500 mg daily.  On ASA, statin, ARB therapy.  Hyperlipidemia On statin therapy.  Takes Lipitor 10 mg daily.  Tolerating medication well.  No myalgias.  BPH  Taking finasteride 5 mg daily and tamsulosin 0.8 mg daily.  Has not had urology evaluation.  Reports medications have not improved symptoms.  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 Former tobacco use 30-year pack history.  Quit 11 years ago. EtOH socially Denies THC/recreational drugs.  Left total knee replacement   OBJECTIVE:  BP 100/80   Pulse 69   Temp 98 F (36.7 C) (Oral)   Resp 17   Ht 6' 1.25" (1.861 m)   Wt 207 lb 8 oz (94.1 kg)   SpO2 97%   BMI 27.19 kg/m    Physical Exam Vitals reviewed.  HENT:     Head: Normocephalic.     Right Ear: Tympanic membrane, ear canal and external ear normal.     Left Ear: Tympanic membrane, ear canal and external ear normal.     Nose: Nose normal.     Mouth/Throat:     Mouth: Mucous membranes are moist.  Eyes:     Conjunctiva/sclera: Conjunctivae normal.     Pupils: Pupils are equal, round, and reactive to light.  Neck:     Thyroid: No thyromegaly or thyroid tenderness.     Vascular: No carotid bruit.  Cardiovascular:     Rate and  Rhythm: Normal rate and regular rhythm.     Pulses: Normal pulses.     Heart sounds: Normal heart sounds.  Pulmonary:     Effort: Pulmonary effort is normal.     Breath sounds: Normal breath sounds.  Abdominal:     General: Abdomen is flat. Bowel sounds are normal.     Palpations: Abdomen is soft.  Musculoskeletal:        General: Normal range of motion.     Cervical back: Normal range of motion and neck supple.     Right lower leg: No edema.     Left lower leg: No edema.  Lymphadenopathy:     Cervical: No cervical adenopathy.  Neurological:     Mental Status: He is alert.  Psychiatric:        Mood and Affect: Mood normal.        Behavior: Behavior normal.        Thought Content: Thought content normal.        Judgment: Judgment normal.     ASSESSMENT/PLAN:  Hypertension associated with diabetes (HCC) Assessment & Plan: Chronic.  Asymptomatic.  Takes losartan 25 mg daily, prescribed 50 mg twice daily.   Decrease losartan to 25 mg daily given this is what patient is taking. Blood pressure well-controlled today Monitor blood pressure at home Labs today  Orders: -     TSH -     CBC with Differential/Platelet -     Comprehensive metabolic panel -     Losartan Potassium; Take 1 tablet (25 mg total) by mouth daily.  Dispense: 90 tablet; Refill: 1  Hyperlipidemia associated with type 2 diabetes mellitus (HCC) Assessment & Plan: Chronic.  On statin therapy.  Tolerating well.  No myalgias. Continue atorvastatin 10 mg daily Check fasting lipids  Orders: -     Lipid panel  Low vitamin D level Assessment & Plan: Chronic. Check vitamin D levels  Orders: -     VITAMIN D 25 Hydroxy (Vit-D Deficiency, Fractures)  Encounter for hepatitis C screening test for low risk patient -     Hepatitis C antibody  Nocturia associated with benign prostatic hyperplasia Assessment & Plan: Chronic.  Stable.  No previous evaluation by urology. Tolerating current  medications. Continue finasteride 5 mg daily Continue tamsulosin 0.8 mg daily Check PSA Referral sent urology.   Controlled type 2 diabetes mellitus with complication, without long-term current use of insulin (HCC) Assessment & Plan: Chronic.  Had elevated A1c of 6.8 in past.  Well-controlled on current medication. Continue metformin 500 mg daily Continue ARB Continue statin therapy Urine ACR Recommend annual diabetic eye exam Recommend annual foot exam Check A1c, CMET, vitamin B12 level    Orders: -     Hemoglobin A1c -     Vitamin B12 -     Microalbumin / creatinine urine ratio; Future -     Ambulatory referral to Ophthalmology  Prostate cancer screening -     PSA  Infrarenal abdominal aortic aneurysm (AAA) without rupture [facility]) Assessment & Plan: History AAA/carotid stenosis Appeared he had an outside ultrasound that was thought does show a 3.1 cm suprarenal AAA.  Evaluated at [facility] vascular in September 2023 and aortic duplex there showed suprarenal aorta measuring 2.8 cm and infrarenal aorta measuring 2.7 cm.  No evidence of AAA on that exam.  Recommendations to repeat carotid duplex and aortic duplex in 2-5 years.  Due 02/2024. Continue statin therapy Continue to be blood pressure less than 130/80 Avoid smoking Follow-up with vascular as scheduled   [facility] Ophthalmology referral for diabetic eye exam Diabetic foot exam at next visit Recommend pneumonia 20 vaccine Colonoscopy every 2 years at [facility].  Due 08/25. Tetanus up-to-date.  Due 8/27 Prostate cancer screening today  PDMP reviewed  Return in about 3 months (around 01/06/2023) for PCP.  Dana Allan, MD

## 2022-10-17 NOTE — Assessment & Plan Note (Addendum)
Chronic.  Asymptomatic.  Takes losartan 25 mg daily, prescribed 50 mg twice daily.   Decrease losartan to 25 mg daily given this is what patient is taking. Blood pressure well-controlled today Monitor blood pressure at home Labs today

## 2022-10-18 ENCOUNTER — Other Ambulatory Visit: Payer: Self-pay

## 2022-10-18 ENCOUNTER — Other Ambulatory Visit: Payer: Self-pay | Admitting: Family Medicine

## 2022-10-18 DIAGNOSIS — N401 Enlarged prostate with lower urinary tract symptoms: Secondary | ICD-10-CM

## 2022-10-18 NOTE — Telephone Encounter (Signed)
Urology referral

## 2022-11-10 ENCOUNTER — Other Ambulatory Visit: Payer: Self-pay | Admitting: *Deleted

## 2022-11-10 ENCOUNTER — Other Ambulatory Visit
Admission: RE | Admit: 2022-11-10 | Discharge: 2022-11-10 | Disposition: A | Discharge status: Home / Self Care | Payer: Federal, State, Local not specified - PPO | Source: Ambulatory Visit | Attending: Urology | Admitting: Urology

## 2022-11-10 ENCOUNTER — Encounter: Payer: Self-pay | Admitting: Urology

## 2022-11-10 ENCOUNTER — Ambulatory Visit: Payer: Federal, State, Local not specified - PPO | Admitting: Urology

## 2022-11-10 VITALS — BP 118/77 | HR 62 | Ht 73.0 in | Wt 203.0 lb

## 2022-11-10 DIAGNOSIS — R351 Nocturia: Secondary | ICD-10-CM

## 2022-11-10 DIAGNOSIS — R350 Frequency of micturition: Secondary | ICD-10-CM | POA: Diagnosis not present

## 2022-11-10 DIAGNOSIS — Z125 Encounter for screening for malignant neoplasm of prostate: Secondary | ICD-10-CM

## 2022-11-10 LAB — URINALYSIS, COMPLETE (UACMP) WITH MICROSCOPIC
Bilirubin Urine: NEGATIVE
Glucose, UA: NEGATIVE mg/dL
Hgb urine dipstick: NEGATIVE
Leukocytes,Ua: NEGATIVE
Nitrite: NEGATIVE
Protein, ur: NEGATIVE mg/dL
Specific Gravity, Urine: 1.02 (ref 1.005–1.030)
Squamous Epithelial / HPF: NONE SEEN /HPF (ref 0–5)
pH: 5.5 (ref 5.0–8.0)

## 2022-11-10 LAB — BLADDER SCAN AMB NON-IMAGING

## 2022-11-10 NOTE — Progress Notes (Signed)
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  He reports a long history of at least a few years of urinary frequency during the day and 2-4 times overnight.  He consumes a large volume of fluid with 80 to 90 ounces of water daily.  Occasionally will have diet soda but primarily drinks water.  He is minimally bothered by his symptoms.  He has been on max dose Flomax and finasteride for at least a few years that were prescribed through PCP, PSA has always been normal, most recently 0.31(corrected for finasteride 0.62) in April 2024.  He denies any dysuria or gross hematuria.  Reports a history of a meatotomy as a child.  He denies any symptoms of sleep apnea.  Urinalysis today benign, PVR normal at 1ml.   PMH: Past Medical History:  Diagnosis Date   Allergy    Meclizine   Arthritis    Cancer (HCC)    Melanoma and SCC   COPD (chronic obstructive pulmonary disease) (HCC)    Maybe but no sx   Diabetes mellitus without complication (HCC)    Prediabetes   Encounter for hepatitis C screening test for low risk patient 10/17/2022   Hyperlipidemia    Hypertension    Pre-diabetes     Surgical History: Past Surgical History:  Procedure Laterality Date   COLONOSCOPY WITH PROPOFOL N/A 06/28/2016   Procedure: COLONOSCOPY WITH PROPOFOL;  Surgeon: Christena Deem, MD;  Location: [facility] ENDOSCOPY;  Service: Endoscopy;  Laterality: N/A;   colonscopy     EYE SURGERY     Bilat cataract surgery   JOINT REPLACEMENT     L knee   KNEE SURGERY     Left   ligation of varicosity in scrotum     TONSILLECTOMY     Family History: Family History  Problem Relation Age of Onset   Heart disease Mother    Hypertension Mother    Varicose Veins Mother    Cancer Father    Alcohol abuse Father     Social History:  reports that he has quit  smoking. He has been exposed to tobacco smoke. He has never used smokeless tobacco. He reports that he does not currently use alcohol. He reports that he does not use drugs.  Physical Exam: BP 118/77   Pulse 62   Ht 6\' 1"  (1.854 m)   Wt 203 lb (92.1 kg)   BMI 26.78 kg/m    Constitutional:  Alert and oriented, No acute distress. Cardiovascular: No clubbing, cyanosis, or edema. Respiratory: Normal respiratory effort, no increased work of breathing. GI: Abdomen is soft, nontender, nondistended, no abdominal masses GU: Patent meatus, no lesions  Assessment & Plan:   67 year old male with urinary frequency and nocturia long-term that is minimally bothersome.  Minimal improvement on finasteride and Flomax.  We discussed possible etiologies including high fluid consumption, BPH, OAB, urethral stricture, pelvic floor dysfunction, or sleep apnea.  He denies  We reviewed options including dietary/behavioral changes, change in pharmacotherapy like an OAB medication or discontinuing finasteride, cystoscopy, or even urodynamics.  He is minimally bothered and would like to start by decreasing his fluid consumption, minimizing fluids prior to bedtime.  He is also in agreement to stop finasteride since he never felt this made any significant improvement.  I think would be reasonable to trial off the Flomax  as well in the future if no changes.  Could consider cystoscopy in the future if worsening symptoms or patient desires further evaluation.  He prefers follow-up as needed  Legrand Rams, MD 11/10/2022  [facility] [address], [address] [place], [place] [address] [phone]

## 2022-11-24 ENCOUNTER — Other Ambulatory Visit: Payer: Self-pay | Admitting: *Deleted

## 2022-11-24 DIAGNOSIS — E1169 Type 2 diabetes mellitus with other specified complication: Secondary | ICD-10-CM

## 2022-11-24 MED ORDER — ATORVASTATIN CALCIUM 10 MG PO TABS: 10.0000 mg | ORAL_TABLET | Freq: Every day | ORAL | 3 refills | Status: DC

## 2022-11-24 NOTE — Telephone Encounter (Signed)
Historical med-per fax request, last fill date: 07/26/2022, last written: 04/27/2022

## 2022-12-28 DIAGNOSIS — M79674 Pain in right toe(s): Secondary | ICD-10-CM | POA: Insufficient documentation

## 2023-01-11 ENCOUNTER — Encounter: Payer: Self-pay | Admitting: Family Medicine

## 2023-01-11 ENCOUNTER — Other Ambulatory Visit: Payer: Self-pay

## 2023-01-11 ENCOUNTER — Ambulatory Visit: Payer: Federal, State, Local not specified - PPO | Admitting: Family Medicine

## 2023-01-11 VITALS — BP 124/74 | HR 62 | Temp 97.8°F | Resp 16 | Ht 73.25 in | Wt 202.4 lb

## 2023-01-11 DIAGNOSIS — E1169 Type 2 diabetes mellitus with other specified complication: Secondary | ICD-10-CM

## 2023-01-11 DIAGNOSIS — Z7984 Long term (current) use of oral hypoglycemic drugs: Secondary | ICD-10-CM | POA: Diagnosis not present

## 2023-01-11 DIAGNOSIS — N401 Enlarged prostate with lower urinary tract symptoms: Secondary | ICD-10-CM

## 2023-01-11 DIAGNOSIS — R351 Nocturia: Secondary | ICD-10-CM

## 2023-01-11 DIAGNOSIS — E785 Hyperlipidemia, unspecified: Secondary | ICD-10-CM

## 2023-01-11 DIAGNOSIS — I152 Hypertension secondary to endocrine disorders: Secondary | ICD-10-CM

## 2023-01-11 DIAGNOSIS — E118 Type 2 diabetes mellitus with unspecified complications: Secondary | ICD-10-CM | POA: Diagnosis not present

## 2023-01-11 DIAGNOSIS — E1159 Type 2 diabetes mellitus with other circulatory complications: Secondary | ICD-10-CM

## 2023-01-11 LAB — MICROALBUMIN / CREATININE URINE RATIO
Creatinine,U: 80.4 mg/dL
Microalb Creat Ratio: 0.9 mg/g (ref 0.0–30.0)
Microalb, Ur: <0.7 mg/dL (ref 0.0–1.9)

## 2023-01-11 NOTE — Progress Notes (Signed)
SUBJECTIVE:   Chief Complaint  Patient presents with   Hypertension   HPI Patient presents to clinic for follow-up chronic disease management  No acute concerns today.  Hypertension Asymptomatic.  Cozaar decreased to 25 mg at last visit.  Blood pressure remains less than 120/80.  Denies any headaches, visual changes, chest pain, shortness of breath or lower extremity edema.  Diabetes type 2 Asymptomatic.  Takes metformin 500 mg daily.  On ASA, statin, ARB therapy.  Reports A1c at highest 6.8.  Wondering if coming, off metformin now.  Diet has changed, weight has decreased significantly since initially diagnosed with diabetes.  Hyperlipidemia Tolerating Lipitor 10 mg daily.  No myalgias.  BPH  Recently seen urology.  Finasteride discontinued.  He continues to take time.  He continues to take Flomax daily.  Urology did recommend he could trial coming off Flomax if no changes in future and consider cystoscopy if symptoms worsen.  Following up as needed.  PERTINENT PMH / PSH: Hypertension BPH Type 2 diabetes Hyperlipidemia COPD Diverticulosis History of melanoma right arm History of squamous cell on right ear Tubular adenoma of colon, biannual colonoscopies followed at [facility] Carotid stenosis. Former tobacco use 30-year pack history.  Quit 11 years ago.  OBJECTIVE:  BP 124/74   Pulse 62   Temp 97.8 F (36.6 C)   Resp 16   Ht 6' 1.25" (1.861 m)   Wt 202 lb 6 oz (91.8 kg)   SpO2 98%   BMI 26.52 kg/m    Physical Exam Vitals reviewed.  Constitutional:      General: He is not in acute distress.    Appearance: Normal appearance. He is normal weight. He is not ill-appearing, toxic-appearing or diaphoretic.  Eyes:     General:        Right eye: No discharge.        Left eye: No discharge.  Cardiovascular:     Rate and Rhythm: Normal rate and regular rhythm.     Heart sounds: Normal heart sounds.  Pulmonary:     Effort: Pulmonary effort is normal.     Breath sounds:  Normal breath sounds.  Abdominal:     General: Bowel sounds are normal.     Palpations: Abdomen is soft.  Musculoskeletal:        General: Normal range of motion.     Cervical back: Normal range of motion.     Right lower leg: No edema.     Left lower leg: No edema.  Skin:    General: Skin is warm and dry.  Neurological:     Mental Status: He is alert and oriented to person, place, and time. Mental status is at baseline.  Psychiatric:        Mood and Affect: Mood normal.        Behavior: Behavior normal.        Thought Content: Thought content normal.        Judgment: Judgment normal.     ASSESSMENT/PLAN:  Controlled type 2 diabetes mellitus with complication, without long-term current use of insulin (HCC) Assessment & Plan: Chronic.  Had elevated A1c of 6.8 in past.  Well-controlled on current medication. A1c today 5.7 Discontinue metformin 500 mg daily Patient to monitor blood glucose, if elevates to greater than 150 will notify MD and will restart metformin Continue ARB Continue statin therapy Recommend annual diabetic eye exam, referral to ophthalmology previously sent.  Information provided to patient to call and follow-up on appointment. Foot exam  completed today    Orders: -     Hemoglobin A1c; Future -     Comprehensive metabolic panel; Future -     Microalbumin / creatinine urine ratio  Hyperlipidemia associated with type 2 diabetes mellitus (HCC) Assessment & Plan: Chronic.  Recent LDL 72.  Goal less than 70. Continue atorvastatin 10 mg daily Recommend modifying diet and increase activity If lipids increase recommend increasing statin therapy    Hypertension associated with diabetes (HCC) Assessment & Plan: Chronic.  Well-controlled on current medication Continue losartan to 25 mg daily  Monitor blood pressure at home       Nocturia associated with benign prostatic hyperplasia Assessment & Plan: Chronic.  No significant change Continues to take  tamsulosin 0.8 mg daily Recent PSA normal Urology evaluated recommended discontinuing finasteride, decreasing fluids.  Trial off Flomax and if worsens can consider cystoscopy in future.  Follow-up with urology as needed.    HCM Ophthalmology referral for diabetic eye exam Diabetic foot exam at next visit Recommend pneumonia 20 vaccine Colonoscopy every 2 years at [facility].  Due 08/25. Tetanus up-to-date.  Due 8/27 Prostate cancer screening today  PDMP reviewed  Return in about 1 year (around 01/11/2024) for PCP.  Dana Allan, MD

## 2023-01-11 NOTE — Patient Instructions (Addendum)
It was a pleasure meeting you today. Thank you for allowing me to take part in your health care.  Our goals for today as we discussed include:  Referral was sent to Ophthalmology Call to schedule appointment with Dr Mickie Kay  A1c5.7 Discontinue Metformin Monitor blood glucose at home  Schedule lab appointment for 6 months  Follow up with PCP in 1 year   If you have any questions or concerns, please do not hesitate to call the office at [phone].  I look forward to our next visit and until then take care and stay safe.  Regards,   Dana Allan, MD   [facility]

## 2023-01-23 ENCOUNTER — Encounter: Payer: Self-pay | Admitting: Family Medicine

## 2023-01-23 NOTE — Assessment & Plan Note (Addendum)
Chronic.  Had elevated A1c of 6.8 in past.  Well-controlled on current medication. A1c today 5.7 Discontinue metformin 500 mg daily Patient to monitor blood glucose, if elevates to greater than 150 will notify MD and will restart metformin Continue ARB Continue statin therapy Recommend annual diabetic eye exam, referral to ophthalmology previously sent.  Information provided to patient to call and follow-up on appointment. Foot exam completed today

## 2023-01-23 NOTE — Assessment & Plan Note (Signed)
Chronic.  No significant change Continues to take tamsulosin 0.8 mg daily Recent PSA normal Urology evaluated recommended discontinuing finasteride, decreasing fluids.  Trial off Flomax and if worsens can consider cystoscopy in future.  Follow-up with urology as needed.

## 2023-01-23 NOTE — Assessment & Plan Note (Addendum)
Chronic.  Recent LDL 72.  Goal less than 70. Continue atorvastatin 10 mg daily Recommend modifying diet and increase activity If lipids increase recommend increasing statin therapy

## 2023-01-23 NOTE — Assessment & Plan Note (Signed)
Chronic.  Well-controlled on current medication Continue losartan to 25 mg daily  Monitor blood pressure at home

## 2023-02-15 ENCOUNTER — Encounter: Payer: Self-pay | Admitting: Family Medicine

## 2023-02-16 ENCOUNTER — Other Ambulatory Visit: Payer: Self-pay | Admitting: Family Medicine

## 2023-02-16 DIAGNOSIS — N401 Enlarged prostate with lower urinary tract symptoms: Secondary | ICD-10-CM

## 2023-02-16 MED ORDER — TAMSULOSIN HCL 0.4 MG PO CAPS: 0.8000 mg | ORAL_CAPSULE | Freq: Every day | ORAL | 3 refills | Status: DC

## 2023-03-22 ENCOUNTER — Other Ambulatory Visit: Payer: Self-pay | Admitting: Family Medicine

## 2023-03-22 DIAGNOSIS — I152 Hypertension secondary to endocrine disorders: Secondary | ICD-10-CM

## 2023-03-24 ENCOUNTER — Other Ambulatory Visit: Payer: Self-pay | Admitting: Family Medicine

## 2023-03-24 DIAGNOSIS — I152 Hypertension secondary to endocrine disorders: Secondary | ICD-10-CM

## 2023-04-25 DIAGNOSIS — G5761 Lesion of plantar nerve, right lower limb: Secondary | ICD-10-CM | POA: Insufficient documentation

## 2023-04-25 DIAGNOSIS — M7741 Metatarsalgia, right foot: Secondary | ICD-10-CM | POA: Insufficient documentation

## 2023-05-20 LAB — HM DIABETES EYE EXAM

## 2023-05-29 ENCOUNTER — Other Ambulatory Visit: Payer: Self-pay | Admitting: Family Medicine

## 2023-05-29 DIAGNOSIS — I152 Hypertension secondary to endocrine disorders: Secondary | ICD-10-CM

## 2023-05-30 MED ORDER — LOSARTAN POTASSIUM 25 MG PO TABS: 25.0000 mg | ORAL_TABLET | Freq: Every day | ORAL | 0 refills | Status: DC

## 2023-07-14 ENCOUNTER — Encounter: Payer: Self-pay | Admitting: Family

## 2023-07-14 ENCOUNTER — Other Ambulatory Visit: Payer: Federal, State, Local not specified - PPO

## 2023-07-14 DIAGNOSIS — E118 Type 2 diabetes mellitus with unspecified complications: Secondary | ICD-10-CM

## 2023-07-14 LAB — COMPREHENSIVE METABOLIC PANEL
ALT: 24 U/L (ref 0–53)
AST: 24 U/L (ref 0–37)
Albumin: 4.4 g/dL (ref 3.5–5.2)
Alkaline Phosphatase: 103 U/L (ref 39–117)
BUN: 15 mg/dL (ref 6–23)
CO2: 30 meq/L (ref 19–32)
Calcium: 9.6 mg/dL (ref 8.4–10.5)
Chloride: 101 meq/L (ref 96–112)
Creatinine, Ser: 0.91 mg/dL (ref 0.40–1.50)
GFR: 86.39 mL/min (ref >60.00)
Glucose, Bld: 103 mg/dL — ABNORMAL HIGH (ref 70–99)
Potassium: 4.1 meq/L (ref 3.5–5.1)
Sodium: 138 meq/L (ref 135–145)
Total Bilirubin: 1 mg/dL (ref 0.2–1.2)
Total Protein: 7.3 g/dL (ref 6.0–8.3)

## 2023-07-14 LAB — HEMOGLOBIN A1C: Hgb A1c MFr Bld: 5.6 % (ref 4.6–6.5)

## 2023-08-25 ENCOUNTER — Other Ambulatory Visit: Payer: Self-pay | Admitting: Family Medicine

## 2023-08-25 DIAGNOSIS — E1159 Type 2 diabetes mellitus with other circulatory complications: Secondary | ICD-10-CM

## 2023-09-07 ENCOUNTER — Telehealth: Payer: Self-pay | Admitting: Family Medicine

## 2023-09-07 NOTE — Telephone Encounter (Signed)
 Dr Clent Ridges is leaving the practice and your appointment on 01/11/24 needs to be rescheduled with another provider. Also you will need to schedule a transfer of care appointment with another provider to continue care at this office. Please call the office to schedule a Transfer of Care to either Dr Charlann Lange, MD, Bethanie Dicker, NP or Kara Dies, NP.   Thank you  E2C2, when patient calls back, please reschedule office visit and schedule a TOC visit separately. [facility]

## 2023-09-17 ENCOUNTER — Encounter: Payer: Self-pay | Admitting: Family Medicine

## 2023-09-22 ENCOUNTER — Ambulatory Visit: Admitting: Family Medicine

## 2023-09-22 ENCOUNTER — Encounter: Payer: Self-pay | Admitting: Family Medicine

## 2023-09-22 VITALS — BP 138/86 | HR 60 | Temp 97.6°F | Resp 20 | Ht 73.25 in | Wt 226.2 lb

## 2023-09-22 DIAGNOSIS — E1159 Type 2 diabetes mellitus with other circulatory complications: Secondary | ICD-10-CM | POA: Diagnosis not present

## 2023-09-22 DIAGNOSIS — E1169 Type 2 diabetes mellitus with other specified complication: Secondary | ICD-10-CM | POA: Diagnosis not present

## 2023-09-22 DIAGNOSIS — R351 Nocturia: Secondary | ICD-10-CM

## 2023-09-22 DIAGNOSIS — E785 Hyperlipidemia, unspecified: Secondary | ICD-10-CM | POA: Diagnosis not present

## 2023-09-22 DIAGNOSIS — I152 Hypertension secondary to endocrine disorders: Secondary | ICD-10-CM | POA: Diagnosis not present

## 2023-09-22 DIAGNOSIS — N401 Enlarged prostate with lower urinary tract symptoms: Secondary | ICD-10-CM | POA: Diagnosis not present

## 2023-09-22 LAB — COMPREHENSIVE METABOLIC PANEL WITH GFR
ALT: 27 U/L (ref 0–53)
AST: 23 U/L (ref 0–37)
Albumin: 4.6 g/dL (ref 3.5–5.2)
Alkaline Phosphatase: 117 U/L (ref 39–117)
BUN: 11 mg/dL (ref 6–23)
CO2: 31 meq/L (ref 19–32)
Calcium: 9.7 mg/dL (ref 8.4–10.5)
Chloride: 101 meq/L (ref 96–112)
Creatinine, Ser: 0.86 mg/dL (ref 0.40–1.50)
GFR: 88.64 mL/min (ref >60.00)
Glucose, Bld: 94 mg/dL (ref 70–99)
Potassium: 4 meq/L (ref 3.5–5.1)
Sodium: 139 meq/L (ref 135–145)
Total Bilirubin: 0.6 mg/dL (ref 0.2–1.2)
Total Protein: 7.5 g/dL (ref 6.0–8.3)

## 2023-09-22 LAB — LIPID PANEL
Cholesterol: 145 mg/dL (ref 0–200)
HDL: 42.3 mg/dL (ref >39.00)
LDL Cholesterol: 74 mg/dL (ref 0–99)
NonHDL: 102.58
Total CHOL/HDL Ratio: 3
Triglycerides: 142 mg/dL (ref 0.0–149.0)
VLDL: 28.4 mg/dL (ref 0.0–40.0)

## 2023-09-22 MED ORDER — TAMSULOSIN HCL 0.4 MG PO CAPS
0.8000 mg | ORAL_CAPSULE | Freq: Every day | ORAL | 3 refills | Status: DC
Start: 2023-09-22 — End: 2024-01-11

## 2023-09-22 MED ORDER — LOSARTAN POTASSIUM 50 MG PO TABS: 50.0000 mg | ORAL_TABLET | Freq: Every day | ORAL | 3 refills | Status: DC

## 2023-09-22 MED ORDER — AMLODIPINE BESYLATE 2.5 MG PO TABS: 2.5000 mg | ORAL_TABLET | Freq: Every day | ORAL | 0 refills | Status: DC

## 2023-09-22 MED ORDER — ATORVASTATIN CALCIUM 10 MG PO TABS: 10.0000 mg | ORAL_TABLET | Freq: Every day | ORAL | 3 refills | Status: DC

## 2023-09-22 NOTE — Progress Notes (Signed)
 SUBJECTIVE:   Chief Complaint  Patient presents with   Hypertension   HPI Follow up for chronic disease management  Discussed the use of AI scribe software for clinical note transcription with the patient, who gave verbal consent to proceed.  History of Present Illness Clifford Ramirez "Clifford Ramirez" is a 68 year old male with hypertension who presents for a follow-up blood pressure check.  He recently increased his losartan  dose to 50 mg, five days ago, to better control his blood pressure. Prior to the adjustment, his blood pressure was around 170 mmHg, and it has since decreased to approximately 138/80 mmHg. He monitors his blood pressure daily, both before taking his medication and later in the afternoon.  He has a history of using amlodipine  in the past but is currently not on it due to potential side effects like peripheral edema, which he has not experienced at lower doses. He is also working on losing weight, which he believes has contributed to the improvement in his blood pressure.  He is due for a colonoscopy and a repeat carotid ultrasound in the fall. His next annual appointment is scheduled for July 30th.    PERTINENT PMH / PSH: As above  OBJECTIVE:  BP 138/86   Pulse 60   Temp 97.6 F (36.4 C) (Oral)   Resp 20   Ht 6' 1.25" (1.861 m)   Wt 226 lb 3 oz (102.6 kg)   SpO2 98%   BMI 29.64 kg/m    Physical Exam Vitals reviewed.  Constitutional:      General: He is not in acute distress.    Appearance: Normal appearance. He is normal weight. He is not ill-appearing, toxic-appearing or diaphoretic.  Eyes:     General:        Right eye: No discharge.        Left eye: No discharge.  Cardiovascular:     Rate and Rhythm: Normal rate and regular rhythm.     Heart sounds: Normal heart sounds.  Pulmonary:     Effort: Pulmonary effort is normal.     Breath sounds: Normal breath sounds.  Skin:    General: Skin is warm and dry.  Neurological:     Mental Status: He is alert  and oriented to person, place, and time. Mental status is at baseline.  Psychiatric:        Mood and Affect: Mood normal.        Behavior: Behavior normal.        Thought Content: Thought content normal.        Judgment: Judgment normal.           09/22/2023    9:58 AM 01/11/2023    8:15 AM 10/07/2022    8:44 AM  Depression screen PHQ 2/9  Decreased Interest 0 0 0  Down, Depressed, Hopeless 0 0 0  PHQ - 2 Score 0 0 0  Altered sleeping 0 0 0  Tired, decreased energy 0 0 0  Change in appetite 0 0 0  Feeling bad or failure about yourself  0 0 0  Trouble concentrating 0 0 0  Moving slowly or fidgety/restless 0 0 0  Suicidal thoughts 0 0 0  PHQ-9 Score 0 0 0  Difficult doing work/chores Not difficult at all Not difficult at all Not difficult at all      09/22/2023    9:58 AM 01/11/2023    8:16 AM 10/07/2022    8:44 AM  GAD 7 : Generalized Anxiety Score  Nervous, Anxious, on Edge 0 0 0  Control/stop worrying 0 0 0  Worry too much - different things 0 0 0  Trouble relaxing 0 0 0  Restless 0 0 0  Easily annoyed or irritable 0 0 0  Afraid - awful might happen 0 0 0  Total GAD 7 Score 0 0 0  Anxiety Difficulty Not difficult at all Not difficult at all Not difficult at all    ASSESSMENT/PLAN:  Hypertension associated with diabetes [facility]) Assessment & Plan: Blood pressure reduced to 138/80s with losartan  50 mg. Target is <140/90. Discussed potential addition of amlodipine  if needed. Explained risks of amlodipine , including peripheral edema. - Continue losartan  50 mg daily. - Check Cmet - Monitor blood pressure daily. - Provide prescription for amlodipine  2.5 mg if needed. - Educate on amlodipine  side effects. - Inform provider if amlodipine  is started.  Orders: -     Comprehensive metabolic panel with GFR -     amLODIPine  Besylate; Take 1 tablet (2.5 mg total) by mouth daily. Start if blood pressure >140/90  Dispense: 90 tablet; Refill: 0 -     Losartan  Potassium; Take 1  tablet (50 mg total) by mouth daily.  Dispense: 90 tablet; Refill: 3  Hyperlipidemia associated with type 2 diabetes mellitus (HCC) Assessment & Plan: Refill Lipitor Check fasting lipids  Orders: -     Lipid panel -     Atorvastatin  Calcium ; Take 1 tablet (10 mg total) by mouth daily.  Dispense: 90 tablet; Refill: 3  Nocturia associated with benign prostatic hyperplasia Assessment & Plan: Refill Flomax   Orders: -     Tamsulosin  HCl; Take 2 capsules (0.8 mg total) by mouth daily after supper.  Dispense: 90 capsule; Refill: 3     General Health Maintenance Due for colonoscopy and carotid ultrasound. - Scheduled colonoscopy in September. - Scheduled carotid ultrasound in the fall.   PDMP reviewed  Return if symptoms worsen or fail to improve, for PCP.  Valli Gaw, MD

## 2023-09-22 NOTE — Patient Instructions (Addendum)
 It was a pleasure meeting you today. Thank you for allowing me to take part in your health care.  Our goals for today as we discussed include:  We will get some labs today.  If they are abnormal or we need to do something about them, I will call you.  If they are normal, I will send you a message on MyChart (if it is active) or a letter in the mail.  If you don't hear from [place] in 2 weeks, please call the office at the number below.   Start Norvasc 2.5 mg at night if BP consistently >140/90.  Please send MyChart message if starting medication.  Printed prescription provided to fill. Continue Losartan 50 mg daily   Refills sent for requested medications  This is a list of the screening recommended for you and due dates:  Health Maintenance  Topic Date Due   COVID-19 Vaccine (6 - 2024-25 season) 02/13/2023   Colon Cancer Screening  01/19/2024   Yearly kidney health urinalysis for diabetes  01/11/2024   Complete foot exam   01/11/2024   Hemoglobin A1C  01/11/2024   Flu Shot  01/13/2024   Eye exam for diabetics  05/19/2024   Yearly kidney function blood test for diabetes  07/13/2024   DTaP/Tdap/Td vaccine (3 - Td or Tdap) 01/12/2032   Pneumonia Vaccine  Completed   Hepatitis C Screening  Completed   Zoster (Shingles) Vaccine  Completed   HPV Vaccine  Aged Out   Meningitis B Vaccine  Aged Out      If you have any questions or concerns, please do not hesitate to call the office at [phone].  I look forward to our next visit and until then take care and stay safe.  Regards,   Dana Allan, MD   [facility]

## 2023-10-02 ENCOUNTER — Encounter: Payer: Self-pay | Admitting: Family Medicine

## 2023-10-02 NOTE — Assessment & Plan Note (Signed)
 Refill Lipitor Check fasting lipids

## 2023-10-02 NOTE — Assessment & Plan Note (Addendum)
 Blood pressure reduced to 138/80s with losartan  50 mg. Target is <140/90. Discussed potential addition of amlodipine  if needed. Explained risks of amlodipine , including peripheral edema. - Continue losartan  50 mg daily. - Check Cmet - Monitor blood pressure daily. - Provide prescription for amlodipine  2.5 mg if needed. - Educate on amlodipine  side effects. - Inform provider if amlodipine  is started.

## 2023-10-02 NOTE — Assessment & Plan Note (Signed)
Refill Flomax 

## 2023-10-15 ENCOUNTER — Encounter: Payer: Self-pay | Admitting: Family Medicine

## 2023-10-17 ENCOUNTER — Other Ambulatory Visit: Payer: Self-pay

## 2023-10-17 MED ORDER — MELOXICAM 15 MG PO TABS
15.0000 mg | ORAL_TABLET | Freq: Every day | ORAL | 3 refills | Status: AC
Start: 2023-10-17 — End: ?

## 2023-10-17 NOTE — Telephone Encounter (Signed)
 Per Pt Last scrip is expired as I use it sparingly but only have 1 pill left. Used more often with my wrist injury which was a sprain. I use [facility] at [address] [employer]. Thanks. 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

## 2023-10-17 NOTE — Telephone Encounter (Signed)
 Pt notified that medication has been sent to the pharmacy.

## 2023-10-17 NOTE — Telephone Encounter (Signed)
 Sent refill request to provider

## 2023-10-18 ENCOUNTER — Encounter: Payer: Self-pay | Admitting: Family Medicine

## 2023-12-19 ENCOUNTER — Other Ambulatory Visit: Payer: Self-pay

## 2023-12-19 DIAGNOSIS — E1159 Type 2 diabetes mellitus with other circulatory complications: Secondary | ICD-10-CM

## 2023-12-19 MED ORDER — AMLODIPINE BESYLATE 2.5 MG PO TABS: 2.5000 mg | ORAL_TABLET | Freq: Every day | ORAL | 0 refills | Status: DC

## 2024-01-04 ENCOUNTER — Other Ambulatory Visit: Payer: Self-pay

## 2024-01-04 DIAGNOSIS — E1159 Type 2 diabetes mellitus with other circulatory complications: Secondary | ICD-10-CM

## 2024-01-04 MED ORDER — AMLODIPINE BESYLATE 2.5 MG PO TABS: 2.5000 mg | ORAL_TABLET | Freq: Every day | ORAL | 0 refills | Status: DC

## 2024-01-11 ENCOUNTER — Ambulatory Visit: Payer: Federal, State, Local not specified - PPO | Admitting: Family Medicine

## 2024-01-12 ENCOUNTER — Ambulatory Visit

## 2024-01-12 ENCOUNTER — Telehealth: Payer: Self-pay | Admitting: Sleep Medicine

## 2024-01-12 VITALS — BP 128/80 | HR 58 | Temp 98.0°F | Ht 73.0 in | Wt 232.8 lb

## 2024-01-12 DIAGNOSIS — E118 Type 2 diabetes mellitus with unspecified complications: Secondary | ICD-10-CM | POA: Diagnosis not present

## 2024-01-12 DIAGNOSIS — L918 Other hypertrophic disorders of the skin: Secondary | ICD-10-CM | POA: Insufficient documentation

## 2024-01-12 DIAGNOSIS — E1169 Type 2 diabetes mellitus with other specified complication: Secondary | ICD-10-CM

## 2024-01-12 DIAGNOSIS — E1159 Type 2 diabetes mellitus with other circulatory complications: Secondary | ICD-10-CM

## 2024-01-12 DIAGNOSIS — E785 Hyperlipidemia, unspecified: Secondary | ICD-10-CM

## 2024-01-12 DIAGNOSIS — N401 Enlarged prostate with lower urinary tract symptoms: Secondary | ICD-10-CM

## 2024-01-12 DIAGNOSIS — D751 Secondary polycythemia: Secondary | ICD-10-CM | POA: Insufficient documentation

## 2024-01-12 DIAGNOSIS — M79674 Pain in right toe(s): Secondary | ICD-10-CM

## 2024-01-12 DIAGNOSIS — E119 Type 2 diabetes mellitus without complications: Secondary | ICD-10-CM

## 2024-01-12 DIAGNOSIS — L989 Disorder of the skin and subcutaneous tissue, unspecified: Secondary | ICD-10-CM | POA: Insufficient documentation

## 2024-01-12 DIAGNOSIS — R351 Nocturia: Secondary | ICD-10-CM

## 2024-01-12 DIAGNOSIS — I152 Hypertension secondary to endocrine disorders: Secondary | ICD-10-CM

## 2024-01-12 DIAGNOSIS — R0683 Snoring: Secondary | ICD-10-CM | POA: Insufficient documentation

## 2024-01-12 MED ORDER — ATORVASTATIN CALCIUM 10 MG PO TABS: 10.0000 mg | ORAL_TABLET | Freq: Every day | ORAL | 3 refills | Status: AC

## 2024-01-12 MED ORDER — TELMISARTAN 40 MG PO TABS: 40.0000 mg | ORAL_TABLET | Freq: Every day | ORAL | 3 refills | Status: DC

## 2024-01-12 MED ORDER — AMLODIPINE BESYLATE 2.5 MG PO TABS: 2.5000 mg | ORAL_TABLET | Freq: Every day | ORAL | 0 refills | Status: DC

## 2024-01-12 MED ORDER — TAMSULOSIN HCL 0.4 MG PO CAPS: 0.4000 mg | ORAL_CAPSULE | Freq: Every day | ORAL | 3 refills | Status: AC

## 2024-01-12 NOTE — Assessment & Plan Note (Signed)
 Recommend staying hydrated, repeat CBC.

## 2024-01-12 NOTE — Assessment & Plan Note (Signed)
 Symptoms stable on Tamsulosin  0.4 mg once a day.

## 2024-01-12 NOTE — Assessment & Plan Note (Addendum)
 Tolerating Atorvastatin  10 mg, goal LDL <70, continue. Repeat fasting lipid panel before next visit.

## 2024-01-12 NOTE — Patient Instructions (Addendum)
 Diet: Emphasize whole grains, lean proteins, fruits, and vegetables. Limit processed foods and sugary drinks. Exercise: Aim for 150 minutes of moderate aerobic activity weekly plus strength training twice a week. Weight Loss: Target 5-10% reduction recommended.  Start Telmisartan  40 mg once a day. Discontinue Losartan . Continue Home BP check. If you develop low BP episodes you can discontinue Amlodipine .

## 2024-01-12 NOTE — Assessment & Plan Note (Signed)
 Right upper eyelid crease. Patient will check with dermatologist for removal.

## 2024-01-12 NOTE — Progress Notes (Signed)
 Established Patient Office Visit TOC from Dr. Hope    Subjective  Patient ID: Clifford Ramirez, male    DOB: 08-27-1955  Age: 68 y.o. 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  HPI Patient presents to establish care, he is here with his wife.   HTN: On Amlodipine  2.5 mg and Losartan  50 mg once a day. No chest pain, shortness of breath, lower legs edema. Home BP has been around 120-130 SBP and DBP 80-90 mmHg.   Carotid bruit: Due for repeat carotid ultrasound in the fall. Asymptomatic.   Nocturia: Related to BPH, on Tamsulosin  0.4 mg once daily.    Snoring: Wife reports patient snores when he is sleeping on certain position. He is easy to fall asleep during day time. Does not take scheduled nap.   OA of wrist: Saw [facility], stable on prn Meloxicam  15 mg.   BMI 30.71: [facility] in [place]: On Mounjaro on 5 mg dose. Not losing weight. Has helped with BG.   Hyperlipidemia: On Atorvastatin  10 mg once daily which he seems to tolerate well. Higher dose of statin in the past caused s/e.   DM II: Had A1c of 6.8% in the past. His A1c has been in prediabetic range since then. Exercises regularly (plays golf).   ROS As per HPI    Objective:     BP 128/80 (BP Location: Right Arm, Patient Position: Sitting, Cuff Size: Normal)   Pulse (!) 58   Temp 98 F (36.7 C) (Oral)   Ht 6' 1 (1.854 m)   Wt 232 lb 12.8 oz (105.6 kg)   SpO2 96%   BMI 30.71 kg/m       01/12/2024    3:10 PM 09/22/2023     9:58 AM 01/11/2023    8:15 AM  Depression screen PHQ 2/9  Decreased Interest 0 0 0  Down, Depressed, Hopeless 0 0 0  PHQ - 2 Score 0 0 0  Altered sleeping 0 0 0  Tired, decreased energy 0 0 0  Change in appetite 0 0 0  Feeling bad or failure about yourself  0 0 0  Trouble concentrating 0 0 0  Moving slowly or fidgety/restless 0 0 0  Suicidal thoughts 0 0 0  PHQ-9 Score 0 0 0  Difficult doing work/chores Not difficult at all Not difficult at all Not difficult at all      01/12/2024    3:11 PM 09/22/2023    9:58 AM 01/11/2023    8:16 AM 10/07/2022    8:44 AM  GAD 7 : Generalized Anxiety Score  Nervous, Anxious, on Edge 0 0 0 0  Control/stop worrying 0 0 0 0  Worry too much - different things 0 0 0 0  Trouble relaxing 0 0 0 0  Restless 0 0 0 0  Easily annoyed or irritable 0 0 0 0  Afraid - awful might happen 0 0 0 0  Total GAD 7 Score 0 0 0 0  Anxiety Difficulty Not difficult at all Not difficult at all Not difficult at all Not difficult at all  01/12/2024    3:10 PM 09/22/2023    9:58 AM 01/11/2023    8:15 AM  Depression screen PHQ 2/9  Decreased Interest 0 0 0  Down, Depressed, Hopeless 0 0 0  PHQ - 2 Score 0 0 0  Altered sleeping 0 0 0  Tired, decreased energy 0 0 0  Change in appetite 0 0 0  Feeling bad or failure about yourself  0 0 0  Trouble concentrating 0 0 0  Moving slowly or fidgety/restless 0 0 0  Suicidal thoughts 0 0 0  PHQ-9 Score 0 0 0  Difficult doing work/chores Not difficult at all Not difficult at all Not difficult at all      01/12/2024    3:11 PM 09/22/2023    9:58 AM 01/11/2023    8:16 AM 10/07/2022    8:44 AM  GAD 7 : Generalized Anxiety Score  Nervous, Anxious, on Edge 0 0 0 0  Control/stop worrying 0 0 0 0  Worry too much - different things 0 0 0 0  Trouble relaxing 0 0 0 0  Restless 0 0 0 0  Easily annoyed or irritable 0 0 0 0  Afraid - awful might happen 0 0 0 0  Total GAD 7 Score 0 0 0 0  Anxiety Difficulty Not difficult at all  Not difficult at all Not difficult at all Not difficult at all   SDOH Screenings   Food Insecurity: No Food Insecurity (09/21/2023)  Housing: Low Risk  (09/21/2023)  Transportation Needs: No Transportation Needs (09/21/2023)  Alcohol Screen: Low Risk  (09/21/2023)  Depression (PHQ2-9): Low Risk  (01/12/2024)  Financial Resource Strain: Low Risk  (09/21/2023)  Physical Activity: Sufficiently Active (09/21/2023)  Social Connections: Socially Integrated (09/21/2023)  Stress: No Stress Concern Present (09/21/2023)  Tobacco Use: Medium Risk (01/12/2024)     Physical Exam Constitutional:      Appearance: Normal appearance.  HENT:     Head: Normocephalic and atraumatic.     Right Ear: Tympanic membrane normal.     Left Ear: Tympanic membrane normal.     Mouth/Throat:     Mouth: Mucous membranes are moist.     Pharynx: Oropharynx is clear.  Eyes:     Comments: Right upper eye lid skin tag.  Neck:     Thyroid: No thyroid mass or thyroid tenderness.  Cardiovascular:     Rate and Rhythm: Normal rate and regular rhythm.  Pulmonary:     Effort: Pulmonary effort is normal.     Breath sounds: Normal breath sounds. No wheezing.  Abdominal:     General: Bowel sounds are normal.     Palpations: Abdomen is soft.     Tenderness: There is no guarding.  Musculoskeletal:     Cervical back: Neck supple. No rigidity.     Right lower leg: No edema.     Left lower leg: No edema.     Comments: B/L lower leg varicose veins without lower leg edema   Skin:    General: Skin is warm.     Findings: Lesion (left lateral thigh, cystic lesion measuring about 1.5 cm in diameter with central hyperkeratosis without discharge, erythema) present.  Neurological:     Mental Status: He is alert and oriented to person, place, and time.     Gait: Gait normal.  Psychiatric:        Mood and Affect: Mood normal.        Behavior: Behavior normal.  Diabetic foot exam was performed with the following findings:   No  deformities, ulcerations, or other skin breakdown Normal sensation of 10g monofilament Intact posterior tibialis and dorsalis pedis pulses     No results found for any visits on 01/12/24.  The 10-year ASCVD risk score (Arnett DK, et al., 2019) is: 31.6%    STOP-BANG Score for Obstructive Sleep Apnea from [email or web address]  on 01/12/2024 ** All calculations should be rechecked by clinician prior to use **  RESULT SUMMARY: 4 points STOP-BANG  High risk of OSA  INPUTS: Do you snore loudly? --> 1 = Yes Do you often feel tired, fatigued, or sleepy during the daytime? --> 0 = No Has anyone observed you stop breathing during sleep? --> 0 = No Do you have (or are you being treated for) high blood pressure? --> 1 = Yes BMI --> 0 = <=35 kg/m Age --> 1 = >50 years Neck circumference --> 0 = <=40 cm Gender --> 1 = Male  Assessment & Plan:  Patient is a pleasant 68 year old male presenting to establish care.  Following lab results discussed during today's visit:  - Albumin: Cr normal 06/25/22 - 07/13/22: A1c 5.6% -  09/22/23: CMP normal, Lipid panel: LDL 74 - 10/07/22 PSA: normal  - 10/07/22: Hb elevated at 17.9, TSH normal, B12 normal, vitamin D  normal   Hypertension associated with diabetes (HCC) Assessment & Plan: Blood pressure goal <138/80 mm Hg Recommend d/c  losartan  50 mg and start Telmisartan  40 mg for longer half life.  Continue Amlodipine  2.5 mg daily.  Continue home BP Check.  If BP within goal consider d/c Amlodipine .  If s/e on Relmisartan patient will update us  and we will consider restarting Losartan  instead.  Check CMP next visit.   Orders: -     amLODIPine  Besylate; Take 1 tablet (2.5 mg total) by mouth daily. Start if blood pressure >140/90  Dispense: 90 tablet; Refill: 0 -     Comprehensive metabolic panel with GFR; Future -     Telmisartan ; Take 1 tablet (40 mg total) by mouth daily.  Dispense: 90 tablet; Refill: 3  Hyperlipidemia associated with type 2 diabetes  mellitus (HCC) Assessment & Plan: Tolerating Atorvastatin  10 mg, goal LDL <70, continue. Repeat fasting lipid panel before next visit.   Orders: -     Atorvastatin  Calcium ; Take 1 tablet (10 mg total) by mouth daily.  Dispense: 90 tablet; Refill: 3 -     Lipid panel; Future  Nocturia associated with benign prostatic hyperplasia Assessment & Plan: Symptoms stable on Tamsulosin  0.4 mg once a day.    Orders: -     Tamsulosin  HCl; Take 1 capsule (0.4 mg total) by mouth daily after supper.  Dispense: 90 capsule; Refill: 3 -     PSA; Future  Controlled type 2 diabetes mellitus with complication, without long-term current use of insulin (HCC) Assessment & Plan: Diet controlled type 2 diabetes. On Mounjaro 5 mg through weight loss clinic in [place]. Patient plans on following with them for now. Has not noted significant weight loss. Will check A1c, urine microalbumin before next appointment.   Orders: -     Hemoglobin A1c; Future -     Microalbumin / creatinine urine ratio; Future  Polycythemia Assessment & Plan: Recommend staying hydrated, repeat CBC.   Orders: -     CBC with Differential/Platelet; Future  Encounter for diabetic foot exam [facility]) Assessment & Plan: Normal exam finding.    Pain of toe of right  foot Assessment & Plan: On prn Meloxicam  15 mg one tablet as needed.    Benign skin lesion of thigh Assessment & Plan: Single cystic lesion on left lateral thigh. Recommend discussing removal with dermatologist first. Patient will let us  know if surgical referral for lesion removal is required in the future.    Skin tag Assessment & Plan: Right upper eyelid crease. Patient will check with dermatologist for removal.   Snoring Assessment & Plan: STOP-BANG score 4. Has cental obesity, nocturia, loud snoring, elevated hb in the past. Recommend evaluation for sleep apnea, referral made.   Orders: -     Pulmonary Visit   I spent 45 minutes on the day of this  face-to-face encounter reviewing the patient's medical and surgical history, current medications, ongoing concerns, and reviewing the assessment and plan with the patient. This time also included counseling the patient on their health conditions and management options. Additionally, I spent time post-visit ordering diagnostic and screening labs to be reviewed during his f/u appointment.   Return in about 4 months (around 05/13/2024) for Chronic f/u with fasting labs 2 days before.   Luke Shade, MD

## 2024-01-12 NOTE — Assessment & Plan Note (Signed)
 Single cystic lesion on left lateral thigh. Recommend discussing removal with dermatologist first. Patient will let us  know if surgical referral for lesion removal is required in the future.

## 2024-01-12 NOTE — Assessment & Plan Note (Signed)
 Normal exam finding.

## 2024-01-12 NOTE — Assessment & Plan Note (Signed)
 On prn Meloxicam  15 mg one tablet as needed.

## 2024-01-12 NOTE — Assessment & Plan Note (Signed)
 Blood pressure goal <138/80 mm Hg Recommend d/c  losartan  50 mg and start Telmisartan  40 mg for longer half life.  Continue Amlodipine  2.5 mg daily.  Continue home BP Check.  If BP within goal consider d/c Amlodipine .  If s/e on Relmisartan patient will update us  and we will consider restarting Losartan  instead.  Check CMP next visit.

## 2024-01-12 NOTE — Assessment & Plan Note (Signed)
 Diet controlled type 2 diabetes. On Mounjaro 5 mg through weight loss clinic in [place]. Patient plans on following with them for now. Has not noted significant weight loss. Will check A1c, urine microalbumin before next appointment.

## 2024-01-12 NOTE — Assessment & Plan Note (Signed)
 STOP-BANG score 4. Has cental obesity, nocturia, loud snoring, elevated hb in the past. Recommend evaluation for sleep apnea, referral made.

## 2024-01-12 NOTE — Telephone Encounter (Signed)
 LVMTCB to schedule sleep consult.

## 2024-01-16 ENCOUNTER — Ambulatory Visit (INDEPENDENT_AMBULATORY_CARE_PROVIDER_SITE_OTHER): Admitting: Sleep Medicine

## 2024-01-16 ENCOUNTER — Encounter: Payer: Self-pay | Admitting: Sleep Medicine

## 2024-01-16 VITALS — BP 122/80 | HR 87 | Temp 98.1°F | Ht 73.0 in | Wt 232.0 lb

## 2024-01-16 DIAGNOSIS — Z6830 Body mass index (BMI) 30.0-30.9, adult: Secondary | ICD-10-CM

## 2024-01-16 DIAGNOSIS — E669 Obesity, unspecified: Secondary | ICD-10-CM

## 2024-01-16 DIAGNOSIS — I10 Essential (primary) hypertension: Secondary | ICD-10-CM

## 2024-01-16 DIAGNOSIS — G4733 Obstructive sleep apnea (adult) (pediatric): Secondary | ICD-10-CM | POA: Diagnosis not present

## 2024-01-16 NOTE — Progress Notes (Unsigned)
 Name:Clifford Ramirez MRN: [id number] DOB: [birth year]   CHIEF COMPLAINT:  EXCESSIVE DAYTIME SLEEPINESS   HISTORY OF PRESENT ILLNESS:  Clifford Ramirez is a 68 y.o. w/ a h/o OSA, obesity and hyperlipidemia who presents for c/o snoring and excessive daytime sleepiness which has been present for several years. Reports nocturnal awakenings due to nocturia,  however does not have difficulty falling back to sleep. Reports a 40 lb weight loss over the last few years. Denies morning headaches, RLS symptoms, dream enactment, cataplexy, hypnagogic or hypnapompic hallucinations. Denies a family history of sleep apnea. Denies drowsy driving. Drinks 1-2 cups of coffee daily, denies alcohol, tobacco or illicit drug use.   Bedtime 9 pm Sleep onset 10 mins Rise time 5 am am   EPWORTH SLEEP SCORE 10    01/16/2024    2:00 PM  Results of the Epworth flowsheet  Sitting and reading 1  Watching TV 3  Sitting, inactive in a public place (e.g. a theatre or a meeting) 1  As a passenger in a car for an hour without a break 0  Lying down to rest in the afternoon when circumstances permit 3  Sitting and talking to someone 0  Sitting quietly after a lunch without alcohol 2  In a car, while stopped for a few minutes in traffic 0  Total score 10     PAST MEDICAL HISTORY :   has a past medical history of Allergy, Arthritis, Cancer (HCC), COPD (chronic obstructive pulmonary disease) (HCC), Diabetes mellitus without complication (HCC), History of cataract (01/25/2020), Hyperlipidemia, Hypertension, Low vitamin D  level (10/17/2022), Nephrolithiasis (02/08/2017), S/P total knee arthroplasty, 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  has a past surgical history that includes Knee surgery; ligation of varicosity in scrotum; Tonsillectomy; colonscopy; Colonoscopy with propofol  (N/A, 06/28/2016); Joint replacement; and Eye  surgery. Prior to Admission medications   Medication Sig Start Date End Date Taking? Authorizing Provider  amLODipine  (NORVASC ) 2.5 MG tablet Take 1 tablet (2.5 mg total) by mouth daily. Start if blood pressure >140/90 01/12/24  Yes Bair, Kalpana, MD  atorvastatin  (LIPITOR) 10 MG tablet Take 1 tablet (10 mg total) by mouth daily. 01/12/24  Yes Bair, Kalpana, MD  Cholecalciferol (VITAMIN D -3 PO) Take by mouth.   Yes [provider]  Coenzyme Q10 (COQ-10) 100 MG CAPS Take by mouth.   Yes [provider]  DOCOSAHEXAENOIC ACID PO Take by mouth.   Yes [provider]  meloxicam  (MOBIC ) 15 MG tablet Take 1 tablet (15 mg total) by mouth daily. 10/17/23  Yes Hope Merle, MD  Multiple Vitamin (MULTIVITAMIN) tablet Take by mouth.   Yes [provider]  tamsulosin  (FLOMAX ) 0.4 MG CAPS capsule Take 1 capsule (0.4 mg total) by mouth daily after supper. 01/12/24  Yes Bair, Kalpana, MD  telmisartan  (MICARDIS ) 40 MG tablet Take 1 tablet (40 mg total) by mouth daily. 01/12/24  Yes Bair, Kalpana, MD   Allergies  Allergen Reactions   Meclizine Other (See Comments)    Turns really red    FAMILY HISTORY:  family history includes Alcohol abuse in his father; Cancer in his father; Heart disease in his mother; Hypertension in his mother; Varicose Veins in his mother. SOCIAL HISTORY:  reports that he has quit smoking. He has been exposed to tobacco smoke. He has never used smokeless tobacco. He reports that he does not currently use alcohol. He reports that he does not use  drugs.   Review of Systems:  Gen:  Denies  fever, sweats, chills weight loss  HEENT: Denies blurred vision, double vision, ear pain, eye pain, hearing loss, nose bleeds, sore throat Cardiac:  No dizziness, chest pain or heaviness, chest tightness,edema, No JVD Resp:   No cough, -sputum production, -shortness of breath,-wheezing, -hemoptysis,  Gi: Denies swallowing difficulty, stomach pain, nausea or vomiting,  diarrhea, constipation, bowel incontinence Gu:  Denies bladder incontinence, burning urine Ext:   Denies Joint pain, stiffness or swelling Skin: Denies  skin rash, easy bruising or bleeding or hives Endoc:  Denies polyuria, polydipsia , polyphagia or weight change Psych:   Denies depression, insomnia or hallucinations  Other:  All other systems negative  VITAL SIGNS: BP 122/80 (BP Location: Right Arm, Patient Position: Sitting, Cuff Size: Large)   Pulse 87   Temp 98.1 F (36.7 C) (Oral)   Ht 6' 1 (1.854 m)   Wt 232 lb (105.2 kg)   SpO2 97%   BMI 30.61 kg/m    Physical Examination:   General Appearance: No distress  EYES PERRLA, EOM intact.   NECK Supple, No JVD Pulmonary: normal breath sounds, No wheezing.  CardiovascularNormal S1,S2.  No m/r/g.   Abdomen: Benign, Soft, non-tender. Skin:   warm, no rashes, no ecchymosis  Extremities: normal, no cyanosis, clubbing. Neuro:without focal findings,  speech normal  PSYCHIATRIC: Mood, affect within normal limits.   ASSESSMENT AND PLAN  OSA I suspect that OSA is likely present due to clinical presentation. Discussed the consequences of untreated sleep apnea. Advised not to drive drowsy for safety of patient and others. Will complete further evaluation with a home sleep study and follow up to review results.    HTN Stable, on current management. Following with PCP.   Obesity Counseled patient on diet and lifestyle modification.    MEDICATION ADJUSTMENTS/LABS AND TESTS ORDERED: Recommend Sleep Study   Patient  satisfied with Plan of action and management. All questions answered  Follow up to review HST results and treatment plan.   I spent a total of 45 minutes reviewing chart data, face-to-face evaluation with the patient, counseling and coordination of care as detailed above.    Lurdes Haltiwanger, M.D.  Sleep Medicine [place] [facility]

## 2024-01-16 NOTE — Patient Instructions (Signed)
 SABRA

## 2024-01-17 ENCOUNTER — Ambulatory Visit: Admitting: Sleep Medicine

## 2024-01-18 ENCOUNTER — Other Ambulatory Visit: Payer: Self-pay

## 2024-01-18 DIAGNOSIS — Z1211 Encounter for screening for malignant neoplasm of colon: Secondary | ICD-10-CM

## 2024-01-18 NOTE — Telephone Encounter (Signed)
 Can we please get more information on this please. Is patient seeing a GI referral to see a GI specialist? Is patient requesting GI referral for colonoscopy or endoscopy or both? Is it for procedure only or he needs to see GI because something has changed from his baseline?    Luke Shade, MD

## 2024-01-30 ENCOUNTER — Encounter

## 2024-01-30 DIAGNOSIS — G4733 Obstructive sleep apnea (adult) (pediatric): Secondary | ICD-10-CM

## 2024-02-02 NOTE — Telephone Encounter (Signed)
 Noted good blood pressure control on telmisartan .  Appropriate response by the staff in regards to GI referral.  Gem Conkle, MD

## 2024-02-10 DIAGNOSIS — R069 Unspecified abnormalities of breathing: Secondary | ICD-10-CM | POA: Diagnosis not present

## 2024-02-14 ENCOUNTER — Ambulatory Visit: Payer: Self-pay

## 2024-02-14 DIAGNOSIS — G4733 Obstructive sleep apnea (adult) (pediatric): Secondary | ICD-10-CM

## 2024-02-19 DIAGNOSIS — I152 Hypertension secondary to endocrine disorders: Secondary | ICD-10-CM

## 2024-02-20 MED ORDER — TELMISARTAN 20 MG PO TABS: 20.0000 mg | ORAL_TABLET | Freq: Every day | ORAL | 3 refills | Status: AC

## 2024-02-20 NOTE — Telephone Encounter (Signed)
 1. Hypertension associated with diabetes (HCC) (Primary) - Dose reduced Telmisartan  from 40 mg daily to 20 mg daily as he is having lower blood pressure episodes at home.  - telmisartan  (MICARDIS ) 20 MG tablet; Take 1 tablet (20 mg total) by mouth daily.  Dispense: 90 tablet; Refill: 3  Izeah Vossler, MD

## 2024-02-29 ENCOUNTER — Telehealth: Payer: Self-pay | Admitting: Sleep Medicine

## 2024-02-29 NOTE — Telephone Encounter (Signed)
 LVMTCB to schedule CPAP compliance between 03/28/2024 to 05/26/2024.

## 2024-04-30 ENCOUNTER — Ambulatory Visit: Admitting: Sleep Medicine

## 2024-04-30 ENCOUNTER — Encounter: Payer: Self-pay | Admitting: Sleep Medicine

## 2024-04-30 VITALS — BP 102/80 | HR 65 | Temp 98.1°F | Ht 73.0 in | Wt 237.2 lb

## 2024-04-30 DIAGNOSIS — G4733 Obstructive sleep apnea (adult) (pediatric): Secondary | ICD-10-CM | POA: Diagnosis not present

## 2024-04-30 DIAGNOSIS — I10 Essential (primary) hypertension: Secondary | ICD-10-CM | POA: Diagnosis not present

## 2024-04-30 DIAGNOSIS — F5104 Psychophysiologic insomnia: Secondary | ICD-10-CM

## 2024-04-30 MED ORDER — TRAZODONE HCL 50 MG PO TABS: 50.0000 mg | ORAL_TABLET | Freq: Every day | ORAL | 3 refills | Status: AC

## 2024-04-30 MED ORDER — TRAZODONE HCL 50 MG PO TABS: 50.0000 mg | ORAL_TABLET | Freq: Every day | ORAL | 3 refills | Status: DC

## 2024-04-30 NOTE — Progress Notes (Signed)
 Name:Clifford Ramirez MRN: [id number] DOB: 05/29/1956   CHIEF COMPLAINT:  CPAP F/U   HISTORY OF PRESENT ILLNESS:  Clifford Ramirez is a 68 y.o. w/ a h/o OSA, obesity and hyperlipidemia who presents for CPAP follow up visit. Reports using CPAP therapy every night, which is confirmed by compliance data. He is currently using the Airfit N30i nasal mask, which is comfortable. Denies air leaks or nasal congestion. Reports difficulty with sleep maintenance due to unclear reasons.    EPWORTH SLEEP SCORE 10    01/16/2024    2:00 PM  Results of the Epworth flowsheet  Sitting and reading 1  Watching TV 3  Sitting, inactive in a public place (e.g. a theatre or a meeting) 1  As a passenger in a car for an hour without a break 0  Lying down to rest in the afternoon when circumstances permit 3  Sitting and talking to someone 0  Sitting quietly after a lunch without alcohol 2  In a car, while stopped for a few minutes in traffic 0  Total score 10    PAST MEDICAL HISTORY :   has a past medical history of Allergy, Arthritis, Cancer (HCC), COPD (chronic obstructive pulmonary disease) (HCC), Diabetes mellitus without complication (HCC), History of cataract (01/25/2020), Hyperlipidemia, Hypertension, Low vitamin D  level (10/17/2022), Nephrolithiasis (02/08/2017), S/P total knee arthroplasty, 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  has a past surgical history that includes Knee surgery; ligation of varicosity in scrotum; Tonsillectomy; colonscopy; Colonoscopy with propofol  (N/A, 06/28/2016); Joint replacement; and Eye surgery. Prior to Admission medications   Medication Sig Start Date End Date Taking? Authorizing Provider  amLODipine  (NORVASC ) 2.5 MG tablet Take 1 tablet (2.5 mg total) by mouth daily. Start if blood pressure >140/90 01/12/24  Yes Bair, Kalpana, MD  atorvastatin  (LIPITOR) 10 MG tablet Take 1 tablet  (10 mg total) by mouth daily. 01/12/24  Yes Bair, Kalpana, MD  Cholecalciferol (VITAMIN D -3 PO) Take by mouth.   Yes [provider]  Coenzyme Q10 (COQ-10) 100 MG CAPS Take by mouth.   Yes [provider]  DOCOSAHEXAENOIC ACID PO Take by mouth.   Yes [provider]  meloxicam  (MOBIC ) 15 MG tablet Take 1 tablet (15 mg total) by mouth daily. 10/17/23  Yes Hope Merle, MD  Multiple Vitamin (MULTIVITAMIN) tablet Take by mouth.   Yes [provider]  tamsulosin  (FLOMAX ) 0.4 MG CAPS capsule Take 1 capsule (0.4 mg total) by mouth daily after supper. 01/12/24  Yes Bair, Kalpana, MD  telmisartan  (MICARDIS ) 40 MG tablet Take 1 tablet (40 mg total) by mouth daily. 01/12/24  Yes Bair, Kalpana, MD   Allergies  Allergen Reactions   Meclizine Other (See Comments)    Turns really red    FAMILY HISTORY:  family history includes Alcohol abuse in his father; Cancer in his father; Heart disease in his mother; Hypertension in his mother; Varicose Veins in his mother. SOCIAL HISTORY:  reports that he has quit smoking. He has been exposed to tobacco smoke. He has never used smokeless tobacco. He reports that he does not currently use alcohol. He reports that he does not use drugs.   Review of Systems:  Gen:  Denies  fever, sweats, chills weight loss  HEENT: Denies blurred vision, double vision, ear pain, eye pain, hearing loss, nose bleeds, sore throat Cardiac:  No dizziness, chest pain or heaviness, chest tightness,edema, No JVD Resp:  No cough, -sputum production, -shortness of breath,-wheezing, -hemoptysis,  Gi: Denies swallowing difficulty, stomach pain, nausea or vomiting, diarrhea, constipation, bowel incontinence Gu:  Denies bladder incontinence, burning urine Ext:   Denies Joint pain, stiffness or swelling Skin: Denies  skin rash, easy bruising or bleeding or hives Endoc:  Denies polyuria, polydipsia , polyphagia or weight change Psych:   Denies depression, insomnia  or hallucinations  Other:  All other systems negative  VITAL SIGNS: BP 102/80   Pulse 65   Temp 98.1 F (36.7 C)   Ht 6' 1 (1.854 m)   Wt 237 lb 3.2 oz (107.6 kg)   SpO2 96%   BMI 31.29 kg/m    Physical Examination:   General Appearance: No distress  EYES PERRLA, EOM intact.   NECK Supple, No JVD Pulmonary: normal breath sounds, No wheezing.  CardiovascularNormal S1,S2.  No m/r/g.   Abdomen: Benign, Soft, non-tender. Skin:   warm, no rashes, no ecchymosis  Extremities: normal, no cyanosis, clubbing. Neuro:without focal findings,  speech normal  PSYCHIATRIC: Mood, affect within normal limits.   ASSESSMENT AND PLAN  OSA Patient is using and benefiting from CPAP therapy. Discussed the consequences of untreated sleep apnea. Advised not to drive drowsy for safety of patient and others. Will follow up in 3 months.    HTN Stable, on current management. Following with PCP.   Insomnia Counseled patient on stimulus control and improving sleep hygiene practices.    Patient  satisfied with Plan of action and management. All questions answered  I spent a total of 20 minutes reviewing chart data, face-to-face evaluation with the patient, counseling and coordination of care as detailed above.    Saagar Tortorella, M.D.  Sleep Medicine [facility]

## 2024-05-03 ENCOUNTER — Ambulatory Visit: Payer: Self-pay | Admitting: Sleep Medicine

## 2024-05-03 ENCOUNTER — Ambulatory Visit: Admitting: Sleep Medicine

## 2024-05-03 DIAGNOSIS — G4733 Obstructive sleep apnea (adult) (pediatric): Secondary | ICD-10-CM

## 2024-05-03 NOTE — Telephone Encounter (Signed)
 FYI Only or Action Required?: FYI only for provider: appointment scheduled on 05/03/24.  Patient is followed in Pulmonology for OSA, last seen on 04/30/2024 by Jess Devona BIRCH, MD.  Called Nurse Triage reporting Obstructive Sleep Apnea (CPAP setting concern).  Symptoms began yesterday.  Interventions attempted: Other: turned CPAP off.  Symptoms are: unchanged.  Triage Disposition: No disposition on file.  Patient/caregiver understands and will follow disposition?: Yes         Copied from CRM [id number]. Topic: Clinical - Red Word Triage >> May 03, 2024  9:12 AM Celestine FALCON wrote: Red Word that prompted transfer to Nurse Triage: Pt see Dr. Jess in [place].  Pt believes his CPAP machine is malfunctioning. Pt stated in the middle of using his CPAP last night, after about the two hour mark, it changed pressure intensely from his standard 4 - 10/12 to 10 to 19. Pt had to turn it off as he was concerned it would hurt his lungs.  Pt does have an appt on 07/31/24 at 315pm. Answer Assessment - Initial Assessment Questions 1. REASON FOR CALL or QUESTION: What is your reason for calling today? or How can I best     CPAP machine setting concern - after having on for 2 hours I've never felt it that strong before -- it was so high that pt turned it off 2. CALLER: Document the source of call. (e.g., laboratory staff, caregiver or patient).     patient  Protocols used: PCP Call - No Triage-A-AH

## 2024-05-03 NOTE — Telephone Encounter (Signed)
 Noted. Dr. Jess here are his current settings and most recent report.

## 2024-05-03 NOTE — Progress Notes (Signed)
 Patient was not seen today.

## 2024-05-03 NOTE — Telephone Encounter (Signed)
 Patient is seeing Dr. Jess today.  Nothing further needed.

## 2024-05-10 ENCOUNTER — Other Ambulatory Visit: Payer: Self-pay | Admitting: Medical Genetics

## 2024-05-11 ENCOUNTER — Other Ambulatory Visit
Admission: RE | Admit: 2024-05-11 | Discharge: 2024-05-11 | Disposition: A | Discharge status: Home / Self Care | Payer: Self-pay | Source: Ambulatory Visit | Attending: Medical Genetics | Admitting: Medical Genetics

## 2024-05-15 ENCOUNTER — Other Ambulatory Visit

## 2024-05-15 DIAGNOSIS — E785 Hyperlipidemia, unspecified: Secondary | ICD-10-CM

## 2024-05-15 DIAGNOSIS — I152 Hypertension secondary to endocrine disorders: Secondary | ICD-10-CM

## 2024-05-15 DIAGNOSIS — R351 Nocturia: Secondary | ICD-10-CM

## 2024-05-15 DIAGNOSIS — E1159 Type 2 diabetes mellitus with other circulatory complications: Secondary | ICD-10-CM | POA: Diagnosis not present

## 2024-05-15 DIAGNOSIS — E1169 Type 2 diabetes mellitus with other specified complication: Secondary | ICD-10-CM

## 2024-05-15 DIAGNOSIS — N401 Enlarged prostate with lower urinary tract symptoms: Secondary | ICD-10-CM

## 2024-05-15 DIAGNOSIS — E118 Type 2 diabetes mellitus with unspecified complications: Secondary | ICD-10-CM

## 2024-05-15 DIAGNOSIS — D751 Secondary polycythemia: Secondary | ICD-10-CM | POA: Diagnosis not present

## 2024-05-15 LAB — COMPREHENSIVE METABOLIC PANEL WITH GFR
ALT: 26 U/L (ref 0–53)
AST: 22 U/L (ref 0–37)
Albumin: 4.5 g/dL (ref 3.5–5.2)
Alkaline Phosphatase: 132 U/L — ABNORMAL HIGH (ref 39–117)
BUN: 12 mg/dL (ref 6–23)
CO2: 30 meq/L (ref 19–32)
Calcium: 9.5 mg/dL (ref 8.4–10.5)
Chloride: 104 meq/L (ref 96–112)
Creatinine, Ser: 0.88 mg/dL (ref 0.40–1.50)
GFR: 87.63 mL/min (ref >60.00)
Glucose, Bld: 100 mg/dL — ABNORMAL HIGH (ref 70–99)
Potassium: 4.3 meq/L (ref 3.5–5.1)
Sodium: 140 meq/L (ref 135–145)
Total Bilirubin: 0.8 mg/dL (ref 0.2–1.2)
Total Protein: 7.1 g/dL (ref 6.0–8.3)

## 2024-05-15 LAB — LIPID PANEL
Cholesterol: 152 mg/dL (ref 0–200)
HDL: 39.1 mg/dL (ref >39.00)
LDL Cholesterol: 84 mg/dL (ref 0–99)
NonHDL: 112.48
Total CHOL/HDL Ratio: 4
Triglycerides: 142 mg/dL (ref 0.0–149.0)
VLDL: 28.4 mg/dL (ref 0.0–40.0)

## 2024-05-15 LAB — CBC WITH DIFFERENTIAL/PLATELET
Basophils Absolute: 0 K/uL (ref 0.0–0.1)
Basophils Relative: 0.8 % (ref 0.0–3.0)
Eosinophils Absolute: 0.1 K/uL (ref 0.0–0.7)
Eosinophils Relative: 1.6 % (ref 0.0–5.0)
HCT: 49.9 % (ref 39.0–52.0)
Hemoglobin: 16.8 g/dL (ref 13.0–17.0)
Lymphocytes Relative: 33.8 % (ref 12.0–46.0)
Lymphs Abs: 1.4 K/uL (ref 0.7–4.0)
MCHC: 33.6 g/dL (ref 30.0–36.0)
MCV: 84.3 fl (ref 78.0–100.0)
Monocytes Absolute: 0.6 K/uL (ref 0.1–1.0)
Monocytes Relative: 14.5 % — ABNORMAL HIGH (ref 3.0–12.0)
Neutro Abs: 2.1 K/uL (ref 1.4–7.7)
Neutrophils Relative %: 49.3 % (ref 43.0–77.0)
Platelets: 167 K/uL (ref 150.0–400.0)
RBC: 5.92 Mil/uL — ABNORMAL HIGH (ref 4.22–5.81)
RDW: 13.3 % (ref 11.5–15.5)
WBC: 4.3 K/uL (ref 4.0–10.5)

## 2024-05-15 LAB — HEMOGLOBIN A1C: Hgb A1c MFr Bld: 5.5 % (ref 4.6–6.5)

## 2024-05-15 LAB — MICROALBUMIN / CREATININE URINE RATIO
Creatinine,U: 98.4 mg/dL
Microalb Creat Ratio: UNDETERMINED mg/g (ref 0.0–30.0)
Microalb, Ur: <0.7 mg/dL

## 2024-05-15 LAB — PSA: PSA: 0.57 ng/mL (ref 0.10–4.00)

## 2024-05-16 ENCOUNTER — Ambulatory Visit: Payer: Self-pay

## 2024-05-16 NOTE — Progress Notes (Signed)
 Hold results, to be discussed during upcoming office visit.   Clifford Shade, MD

## 2024-05-17 ENCOUNTER — Ambulatory Visit

## 2024-05-17 VITALS — BP 102/70 | HR 66 | Temp 98.4°F | Ht 73.0 in | Wt 226.2 lb

## 2024-05-17 DIAGNOSIS — R748 Abnormal levels of other serum enzymes: Secondary | ICD-10-CM | POA: Diagnosis not present

## 2024-05-17 DIAGNOSIS — G4733 Obstructive sleep apnea (adult) (pediatric): Secondary | ICD-10-CM | POA: Insufficient documentation

## 2024-05-17 DIAGNOSIS — R7989 Other specified abnormal findings of blood chemistry: Secondary | ICD-10-CM | POA: Insufficient documentation

## 2024-05-17 DIAGNOSIS — Z87891 Personal history of nicotine dependence: Secondary | ICD-10-CM | POA: Diagnosis not present

## 2024-05-17 DIAGNOSIS — E1159 Type 2 diabetes mellitus with other circulatory complications: Secondary | ICD-10-CM

## 2024-05-17 DIAGNOSIS — I6523 Occlusion and stenosis of bilateral carotid arteries: Secondary | ICD-10-CM | POA: Insufficient documentation

## 2024-05-17 DIAGNOSIS — I152 Hypertension secondary to endocrine disorders: Secondary | ICD-10-CM

## 2024-05-17 NOTE — Assessment & Plan Note (Addendum)
 Mild elevation in monocytes, CBC with history of hemoconcentration in the past.  Lab results non-specific which could possibly due to fatty liver, OSA, inflammation. No fever, chills, unintentional weight loss.  Repeat CBC in three months. Consider hematologist referral if monocytes rise or hemoglobin remains elevated. Also encouraged continue compliance with use of CPAP.  Orders:   CBC w/Diff; Future

## 2024-05-17 NOTE — Progress Notes (Signed)
 Established Patient Office Visit   Subjective  Patient ID: Clifford Ramirez, male    DOB: [birth year]  Age: 68 y.o. MRN: [id number]  Chief Complaint  Patient presents with   Diabetes   Hyperlipidemia   Hypertension    Discussed the use of AI scribe software for clinical note transcription with the patient, who gave verbal consent to proceed.  History of Present Illness Clifford Ramirez is a 68 year old male who presents for a follow-up visit.  - OSA: He is using a new CPAP machine for his sleep apnea. He uses trazodone  for sleep disturbances, particularly for nocturnal awakenings. He generally sleeps 5 to 5.5 hours and feels energetic with this amount of sleep. Trazodone  does not significantly improve his sleep, but he occasionally sleeps through the night.  - DM, weight: A1c has been stable. He is on semaglutide for weight management, administered as 16 units twice a week, after discovering a previous batch was less potent. He previously tried tirzepatide without success.  - He has a history of hypertension and adjusts his medication regimen based on blood pressure readings. He takes his blood pressure medication every two to three days to avoid hypotension, as daily intake results in low blood pressure. Half doses daily also resulted in low blood pressure.  - He takes atorvastatin  10 mg for cholesterol management and reports a long history of slightly elevated cholesterol levels. He also takes CoQ10, a fatty acid pill, and a multivitamin. He does not consume alcohol due to a family history of alcoholism.   - He experiences dry eyes, particularly in the left eye, which waters frequently. He does not regularly use lubricating eye drops but finds relief with a single drop when needed. He wears sunglasses during outdoor activities like golf to protect his eyes.  - He remains active, playing golf regularly, and reports no significant symptoms such as dizziness or weakness during physical  activity.    ROS As per HPI    Objective:     BP 102/70 (BP Location: Right Arm, Patient Position: Sitting, Cuff Size: Normal)   Pulse 66   Temp 98.4 F (36.9 C) (Oral)   Ht 6' 1 (1.854 m)   Wt 226 lb 3.2 oz (102.6 kg)   SpO2 95%   BMI 29.84 kg/m      05/17/2024    4:05 PM 01/12/2024    3:10 PM 09/22/2023    9:58 AM  Depression screen PHQ 2/9  Decreased Interest 0 0 0  Down, Depressed, Hopeless 0 0 0  PHQ - 2 Score 0 0 0  Altered sleeping 0 0 0  Tired, decreased energy 0 0 0  Change in appetite 0 0 0  Feeling bad or failure about yourself  0 0 0  Trouble concentrating 0 0 0  Moving slowly or fidgety/restless 0 0 0  Suicidal thoughts 0 0 0  PHQ-9 Score 0 0  0   Difficult doing work/chores Not difficult at all Not difficult at all Not difficult at all     Data saved with a previous flowsheet row definition      05/17/2024    4:05 PM 01/12/2024    3:11 PM 09/22/2023    9:58 AM 01/11/2023    8:16 AM  GAD 7 : Generalized Anxiety Score  Nervous, Anxious, on Edge 0 0 0 0  Control/stop worrying 0 0 0 0  Worry too much - different things 0 0 0 0  Trouble relaxing 0 0 0  0  Restless 0 0 0 0  Easily annoyed or irritable 0 0 0 0  Afraid - awful might happen 0 0 0 0  Total GAD 7 Score 0 0 0 0  Anxiety Difficulty Not difficult at all Not difficult at all Not difficult at all Not difficult at all      05/17/2024    4:05 PM 01/12/2024    3:10 PM 09/22/2023    9:58 AM  Depression screen PHQ 2/9  Decreased Interest 0 0 0  Down, Depressed, Hopeless 0 0 0  PHQ - 2 Score 0 0 0  Altered sleeping 0 0 0  Tired, decreased energy 0 0 0  Change in appetite 0 0 0  Feeling bad or failure about yourself  0 0 0  Trouble concentrating 0 0 0  Moving slowly or fidgety/restless 0 0 0  Suicidal thoughts 0 0 0  PHQ-9 Score 0 0  0   Difficult doing work/chores Not difficult at all Not difficult at all Not difficult at all     Data saved with a previous flowsheet row definition       05/17/2024    4:05 PM 01/12/2024    3:11 PM 09/22/2023    9:58 AM 01/11/2023    8:16 AM  GAD 7 : Generalized Anxiety Score  Nervous, Anxious, on Edge 0 0 0 0  Control/stop worrying 0 0 0 0  Worry too much - different things 0 0 0 0  Trouble relaxing 0 0 0 0  Restless 0 0 0 0  Easily annoyed or irritable 0 0 0 0  Afraid - awful might happen 0 0 0 0  Total GAD 7 Score 0 0 0 0  Anxiety Difficulty Not difficult at all Not difficult at all Not difficult at all Not difficult at all   SDOH Screenings   Food Insecurity: No Food Insecurity (05/14/2024)  Housing: Low Risk  (05/14/2024)  Transportation Needs: No Transportation Needs (05/14/2024)  Alcohol Screen: Low Risk  (09/21/2023)  Depression (PHQ2-9): Low Risk  (05/17/2024)  Financial Resource Strain: Low Risk  (05/14/2024)  Physical Activity: Sufficiently Active (05/14/2024)  Social Connections: Socially Integrated (05/14/2024)  Stress: No Stress Concern Present (05/14/2024)  Tobacco Use: Medium Risk (05/17/2024)     Physical Exam Constitutional:      General: He is not in acute distress. HENT:     Head: Normocephalic and atraumatic.     Mouth/Throat:     Mouth: Mucous membranes are moist.  Neck:     Vascular: No carotid bruit.  Cardiovascular:     Rate and Rhythm: Normal rate.     Heart sounds: No murmur heard. Pulmonary:     Effort: Pulmonary effort is normal.     Breath sounds: Normal breath sounds.  Abdominal:     Palpations: Abdomen is soft.     Tenderness: There is no abdominal tenderness. There is no guarding or rebound.  Musculoskeletal:     Cervical back: Neck supple.     Right lower leg: No edema.     Left lower leg: No edema.  Lymphadenopathy:     Cervical: No cervical adenopathy.  Neurological:     Mental Status: He is alert and oriented to person, place, and time.  Psychiatric:        Mood and Affect: Mood normal.        No results found for any visits on 05/17/24.  The 10-year ASCVD risk score (Arnett DK,  et al., 2019)  is: 23.6%    Following lab results discussed:  Component     Latest Ref Rng 05/15/2024  WBC     4.0 - 10.5 K/uL 4.3   RBC     4.22 - 5.81 Mil/uL 5.92 (H)   Hemoglobin     13.0 - 17.0 g/dL 83.1   HCT     60.9 - 47.9 % 49.9   MCV     78.0 - 100.0 fl 84.3   MCHC     30.0 - 36.0 g/dL 66.3   RDW     88.4 - 84.4 % 13.3   Platelets     150.0 - 400.0 K/uL 167.0   Neutrophils     43.0 - 77.0 % 49.3   Lymphocytes     12.0 - 46.0 % 33.8   Monocytes Relative     3.0 - 12.0 % 14.5 (H)   Eosinophil     0.0 - 5.0 % 1.6   Basophil     0.0 - 3.0 % 0.8   NEUT#     1.4 - 7.7 K/uL 2.1   Lymphs Abs     0.7 - 4.0 K/uL 1.4   Monocyte #     0.1 - 1.0 K/uL 0.6   Eosinophils Absolute     0.0 - 0.7 K/uL 0.1   Basophils Absolute     0.0 - 0.1 K/uL 0.0   Sodium     135 - 145 mEq/L 140   Potassium     3.5 - 5.1 mEq/L 4.3   Chloride     96 - 112 mEq/L 104   CO2     19 - 32 mEq/L 30   Glucose     70 - 99 mg/dL 899 (H)   BUN     6 - 23 mg/dL 12   Creatinine     9.59 - 1.50 mg/dL 9.11   Total Bilirubin     0.2 - 1.2 mg/dL 0.8   Alkaline Phosphatase     39 - 117 U/L 132 (H)   AST     0 - 37 U/L 22   ALT     0 - 53 U/L 26   Total Protein     6.0 - 8.3 g/dL 7.1   Albumin     3.5 - 5.2 g/dL 4.5   GFR     >39.99 mL/min 87.63   Calcium      8.4 - 10.5 mg/dL 9.5   Cholesterol     0 - 200 mg/dL 847   Triglycerides     0.0 - 149.0 mg/dL 857.9   HDL Cholesterol     >39.00 mg/dL 60.89   VLDL     0.0 - 40.0 mg/dL 71.5   LDL (calc)     0 - 99 mg/dL 84   Total CHOL/HDL Ratio 4   NonHDL 112.48   Microalb, Ur     mg/dL <9.2   Creatinine,U     mg/dL 01.5   MICROALB/CREAT RATIO     0.0 - 30.0 mg/g Unable to calculate   PSA     0.10 - 4.00 ng/mL 0.57   Hemoglobin A1C     4.6 - 6.5 % 5.5      Assessment & Plan:   Assessment & Plan Abnormal CBC Mild elevation in monocytes, CBC with history of hemoconcentration in the past.  Lab results non-specific which could  possibly due to fatty liver, OSA, inflammation. No fever, chills, unintentional weight loss.  Repeat CBC in three months. Consider hematologist referral if monocytes rise or hemoglobin remains elevated. Also encouraged continue compliance with use of CPAP.  Orders:   CBC w/Diff; Future  Elevated alkaline phosphatase level Chronic, asymptomatic. Update RUQ ultrasound to r/o fatty liver.  Repeat CMP in 3 months.  Orders:   COMPLETE METABOLIC PANEL WITH eGFR; Future   US  Abdomen Limited RUQ (LIVER/GB); Future  Mild atherosclerosis of carotid artery, bilateral Patient asymptomatic.  H/o mild b/l carotid artery disease, h/o smoking, htn, hyperlipidemia.  Reviewed visit note from [facility] vascular televisit (03/04/2022) no evidence of abdominal aortic aneurysm on duplex studies from 02/23/2021 and 03/06/2022. Given the patient's smoking history, repeat aortic duplex is recommended once more in the next 2-5 years; if negative, further surveillance can be discontinued. The patient is a former smoker with hypertension and well-controlled diabetes (A1c <6.1% over the past year). He has a history of TIA, with the last carotid duplex performed on 07/17/2019; repeat carotid duplex is advised in 2-5 years, and if no significant stenosis is found, routine follow-up can be stopped. Repeat b/o carotid ultrasound.  Orders:   US  Carotid Bilateral; Future  Smoking history Plan per mild atherosclerosis of carotid artery b/l.  Orders:   US  AORTA DUPLEX LIMITED; Future  Hypertension associated with diabetes (HCC) Hypertension well-controlled with intermittent therapy with Telmisartan  20 mg. Medication taken every 2-3 days to avoid hypotension. Continue current antihypertensive regimen. Monitor blood pressure regularly and adjust if hypotension occurs.     OSA on CPAP Encouraged consistent CPAP use. Continue f/u with Dr. Jess as recommended.       Return for non fasting lab in 3 M, f/u with Dr. Abbey in 6  months for chronic .   Luke Abbey, MD

## 2024-05-17 NOTE — Assessment & Plan Note (Addendum)
 Chronic, asymptomatic. Update RUQ ultrasound to r/o fatty liver.  Repeat CMP in 3 months.  Orders:   COMPLETE METABOLIC PANEL WITH eGFR; Future   US  Abdomen Limited RUQ (LIVER/GB); Future

## 2024-05-17 NOTE — Assessment & Plan Note (Signed)
 Hypertension well-controlled with intermittent therapy with Telmisartan  20 mg. Medication taken every 2-3 days to avoid hypotension. Continue current antihypertensive regimen. Monitor blood pressure regularly and adjust if hypotension occurs.

## 2024-05-17 NOTE — Progress Notes (Signed)
 Defer to OV from 05/17/24.   Luke Shade, MD

## 2024-05-17 NOTE — Assessment & Plan Note (Signed)
 Encouraged consistent CPAP use. Continue f/u with Dr. Jess as recommended.

## 2024-05-17 NOTE — Assessment & Plan Note (Addendum)
 Plan per mild atherosclerosis of carotid artery b/l.  Orders:   US  AORTA DUPLEX LIMITED; Future

## 2024-05-17 NOTE — Assessment & Plan Note (Addendum)
 Patient asymptomatic.  H/o mild b/l carotid artery disease, h/o smoking, htn, hyperlipidemia.  Reviewed visit note from [facility] vascular televisit (03/04/2022) no evidence of abdominal aortic aneurysm on duplex studies from 02/23/2021 and 03/06/2022. Given the patient's smoking history, repeat aortic duplex is recommended once more in the next 2-5 years; if negative, further surveillance can be discontinued. The patient is a former smoker with hypertension and well-controlled diabetes (A1c <6.1% over the past year). He has a history of TIA, with the last carotid duplex performed on 07/17/2019; repeat carotid duplex is advised in 2-5 years, and if no significant stenosis is found, routine follow-up can be stopped. Repeat b/o carotid ultrasound.  Orders:   US  Carotid Bilateral; Future

## 2024-05-23 ENCOUNTER — Ambulatory Visit: Admission: RE | Admit: 2024-05-23 | Discharge: 2024-05-23 | Disposition: A | Source: Ambulatory Visit

## 2024-05-23 DIAGNOSIS — Z87891 Personal history of nicotine dependence: Secondary | ICD-10-CM | POA: Insufficient documentation

## 2024-05-23 DIAGNOSIS — I6523 Occlusion and stenosis of bilateral carotid arteries: Secondary | ICD-10-CM | POA: Insufficient documentation

## 2024-05-23 DIAGNOSIS — R748 Abnormal levels of other serum enzymes: Secondary | ICD-10-CM | POA: Insufficient documentation

## 2024-05-23 LAB — GENECONNECT MOLECULAR SCREEN: Genetic Analysis Overall Interpretation: NEGATIVE

## 2024-05-24 ENCOUNTER — Ambulatory Visit: Payer: Self-pay

## 2024-05-24 DIAGNOSIS — K760 Fatty (change of) liver, not elsewhere classified: Secondary | ICD-10-CM | POA: Insufficient documentation

## 2024-05-24 DIAGNOSIS — I7143 Infrarenal abdominal aortic aneurysm, without rupture: Secondary | ICD-10-CM

## 2024-05-24 DIAGNOSIS — I6523 Occlusion and stenosis of bilateral carotid arteries: Secondary | ICD-10-CM

## 2024-05-24 NOTE — Progress Notes (Signed)
 Following mychart message sent on 05/24/24 to RUQ ultrasound result.   Hello Clifford Ramirez,    I wanted to update you on your recent imaging results:   - The ultrasound of your abdominal aorta shows a 3 cm proximal abdominal aortic aneurysm. This is a finding we will continue to monitor. Current guidelines recommend a follow-up ultrasound in 3 years to keep track of any changes. If you would like, we can refer you to our vascular surgery department, or you may continue your follow-up with [facility] vascular surgery. Alternatively, we can simply repeat the aortic ultrasound in 3 years if you prefer.   - Your bilateral carotid ultrasound is reassuring. There is minor carotid atherosclerosis, but no significant narrowing--less than 50% on both sides. Blood flow in the vertebral arteries is normal.    - The liver ultrasound suggests fatty changes. It is important to continue working on lifestyle modifications, including weight loss and a diet low in carbohydrates and sugars. We will keep monitoring your liver health.     Please let us  know your preference or if you have any questions.   Sincerely, Luke Shade, MD

## 2024-05-24 NOTE — Progress Notes (Signed)
 Following mychart message sent on 05/24/24 to RUQ ultrasound result.   Hello Don,    I wanted to update you on your recent imaging results:   - The ultrasound of your abdominal aorta shows a 3 cm proximal abdominal aortic aneurysm. This is a finding we will continue to monitor. Current guidelines recommend a follow-up ultrasound in 3 years to keep track of any changes. If you would like, we can refer you to our vascular surgery department, or you may continue your follow-up with [facility] vascular surgery. Alternatively, we can simply repeat the aortic ultrasound in 3 years if you prefer.   - Your bilateral carotid ultrasound is reassuring. There is minor carotid atherosclerosis, but no significant narrowing--less than 50% on both sides. Blood flow in the vertebral arteries is normal.    - The liver ultrasound suggests fatty changes. It is important to continue working on lifestyle modifications, including weight loss and a diet low in carbohydrates and sugars. We will keep monitoring your liver health.     Please let us  know your preference or if you have any questions.   Sincerely, Luke Shade, MD

## 2024-07-18 LAB — OPHTHALMOLOGY REPORT-SCANNED

## 2024-07-31 ENCOUNTER — Ambulatory Visit: Admitting: Sleep Medicine

## 2024-08-16 ENCOUNTER — Other Ambulatory Visit

## 2024-11-15 ENCOUNTER — Ambulatory Visit
